# Patient Record
Sex: Male | Born: 1950 | Race: White | Hispanic: No | Marital: Married | Smoking: Former smoker
Health system: Southern US, Community
[De-identification: ages and names within clinical notes are randomized; demographics above are authoritative.]

## PROBLEM LIST (undated history)

## (undated) DIAGNOSIS — I219 Acute myocardial infarction, unspecified: Secondary | ICD-10-CM

## (undated) DIAGNOSIS — E119 Type 2 diabetes mellitus without complications: Secondary | ICD-10-CM

## (undated) DIAGNOSIS — I2510 Atherosclerotic heart disease of native coronary artery without angina pectoris: Secondary | ICD-10-CM

## (undated) DIAGNOSIS — I10 Essential (primary) hypertension: Secondary | ICD-10-CM

## (undated) DIAGNOSIS — E7800 Pure hypercholesterolemia, unspecified: Secondary | ICD-10-CM

## (undated) HISTORY — DX: Pure hypercholesterolemia, unspecified: E78.00

## (undated) HISTORY — PX: GALLBLADDER SURGERY: SHX652

## (undated) HISTORY — DX: Acute myocardial infarction, unspecified: I21.9

## (undated) HISTORY — DX: Essential (primary) hypertension: I10

## (undated) HISTORY — PX: BACK SURGERY: SHX140

## (undated) HISTORY — PX: STENT PLACEMENT VASCULAR (ARMC HX): HXRAD1737

## (undated) HISTORY — DX: Atherosclerotic heart disease of native coronary artery without angina pectoris: I25.10

## (undated) HISTORY — PX: OTHER SURGICAL HISTORY: SHX169

## (undated) HISTORY — DX: Type 2 diabetes mellitus without complications: E11.9

---

## 2014-10-14 DIAGNOSIS — E119 Type 2 diabetes mellitus without complications: Secondary | ICD-10-CM | POA: Insufficient documentation

## 2014-10-14 DIAGNOSIS — E7800 Pure hypercholesterolemia, unspecified: Secondary | ICD-10-CM | POA: Insufficient documentation

## 2014-12-23 ENCOUNTER — Ambulatory Visit (INDEPENDENT_AMBULATORY_CARE_PROVIDER_SITE_OTHER)
Admission: RE | Admit: 2014-12-23 | Discharge: 2014-12-23 | Disposition: A | Discharge status: Home / Self Care | Payer: Medicare Other | Source: Ambulatory Visit | Attending: Internal Medicine | Admitting: Internal Medicine

## 2014-12-23 ENCOUNTER — Encounter: Payer: Self-pay | Admitting: Internal Medicine

## 2014-12-23 ENCOUNTER — Ambulatory Visit (INDEPENDENT_AMBULATORY_CARE_PROVIDER_SITE_OTHER): Payer: Medicare Other | Admitting: Internal Medicine

## 2014-12-23 VITALS — BP 126/70 | HR 80 | Ht 68.0 in | Wt 282.0 lb

## 2014-12-23 DIAGNOSIS — R0600 Dyspnea, unspecified: Secondary | ICD-10-CM | POA: Diagnosis not present

## 2014-12-23 DIAGNOSIS — R0609 Other forms of dyspnea: Secondary | ICD-10-CM

## 2014-12-23 DIAGNOSIS — I10 Essential (primary) hypertension: Secondary | ICD-10-CM | POA: Diagnosis not present

## 2014-12-23 MED ORDER — VALSARTAN 80 MG PO TABS
80.0000 mg | ORAL_TABLET | Freq: Every day | ORAL | Status: DC
Start: 2014-12-23 — End: 2015-05-09

## 2014-12-23 NOTE — Progress Notes (Signed)
Subjective:    Patient ID: Nathan White, male    DOB: [birth year]   MRN: [id number]  HPI  70 yowm  Quit smoking 1995  Due to IHD   at wt 211 with no respiratory problems until May 2015 onset sob/ cp self referred to pulmonary clinic 12/23/2014 p w/u at [facility] neg cards/ pulm ([facility]) per pt.    12/23/2014 1st [facility] office visit/ Wert   Chief Complaint  Patient presents with  . Pulmonary Consult    Self referred. Pt c/o increased SOB with activity, and a warm burning sensation in his chest.  Denies any wheezing, cough, chest congestion/tightness, and sinus drainage.   new onset doe with neg cards w/u and Dr Gerome Apley eval with no dx.  Has chest tightness reproducible with exertion more than 50 ft walking "almost every time, but not always"  - when walks for doctors it never happens.   Smother at hs most nights x 6 months, sleeps in recliner p xanax - no resting symptoms upright   No am cough / lots of acid HB chronically assoc with sensation of constant pnds   No obvious  patterns in day to day or daytime variabilty or assoc  subjective wheeze os. No unusual exp hx or h/o childhood pna/ asthma or knowledge of premature birth.   . Also denies any obvious fluctuation of symptoms with weather or environmental changes or other aggravating or alleviating factors except as outlined above   Current Medications, Allergies, Complete Past Medical History, Past Surgical History, Family History, and Social History were reviewed in [employer] record.          Review of Systems  Constitutional: Negative for fever and unexpected weight change.  HENT: Positive for sneezing. Negative for congestion, dental problem, ear pain, postnasal drip, rhinorrhea, sinus pressure, sore throat and trouble swallowing.   Eyes: Negative for redness and itching.  Respiratory: Positive for shortness of breath. Negative for cough, chest tightness and wheezing.   Cardiovascular: Negative for  palpitations and leg swelling.  Gastrointestinal: Negative for nausea and vomiting.  Genitourinary: Negative for dysuria.  Musculoskeletal: Negative for joint swelling.  Skin: Negative for rash.  Neurological: Negative for headaches.  Hematological: Bruises/bleeds easily.  Psychiatric/Behavioral: Negative for dysphoric mood. The patient is not nervous/anxious.        Objective:   Physical Exam  amb obese wm nad mod voice fatigue   Wt Readings from Last 3 Encounters:  12/23/14 282 lb (127.914 kg)    Vital signs reviewed   HEENT: nl dentition, turbinates, and orophanx. Nl external ear canals without cough reflex   NECK :  without JVD/Nodes/TM/ nl carotid upstrokes bilaterally   LUNGS: no acc muscle use, clear to A and P bilaterally without cough on insp or exp maneuvers   CV:  RRR  no s3 or murmur or increase in P2, no edema   ABD:  soft and nontender with nl excursion in the supine position. No bruits or organomegaly, bowel sounds nl  MS:  warm without deformities, calf tenderness, cyanosis or clubbing  SKIN: warm and dry without lesions    NEURO:  alert, approp, no deficits    CXR PA and Lateral:   12/23/2014 :     I personally reviewed images and agree with radiology impression as follows:    1. No acute findings. 2. Pleural parenchymal scarring at the base of the left hemi thorax          Assessment &  Plan:

## 2014-12-23 NOTE — Patient Instructions (Addendum)
Stop lisinopril and start diovan (valsartan) 80 mg one daily instead - call me if any issues with this substitution  Omeprazole (prilosec) 40 mg Take 30-60 min before first meal of the day and pepcid ac 20 mg at bedtime   GERD (REFLUX)  is an extremely common cause of respiratory symptoms just like yours , many times with no obvious heartburn at all.    It can be treated with medication, but also with lifestyle changes including elevation of the head of your bed (ideally with 6 inch  bed blocks),  Smoking cessation, avoidance of late meals, excessive alcohol, and avoid fatty foods, chocolate, peppermint, colas, red wine, and acidic juices such as orange juice.  NO MINT OR MENTHOL PRODUCTS SO NO COUGH DROPS  USE SUGARLESS CANDY INSTEAD (Jolley ranchers or Stover's or Life Savers) or even ice chips will also do - the key is to swallow to prevent all throat clearing. NO OIL BASED VITAMINS - use powdered substitutes.  Please remember to go to the  x-ray department downstairs for your tests - we will call you with the results when they are available.     Please schedule a follow up office visit in 6 weeks, call sooner if needed - sign for records from Dr Gerome Apley to include pfts-

## 2014-12-24 ENCOUNTER — Encounter: Payer: Self-pay | Admitting: Internal Medicine

## 2014-12-24 DIAGNOSIS — I10 Essential (primary) hypertension: Secondary | ICD-10-CM | POA: Insufficient documentation

## 2014-12-24 NOTE — Assessment & Plan Note (Signed)
Body mass index is 42.89 kg/(m^2).  No results found for: TSH   Contributing to gerd tendency/ doe/reviewed the need and the process to achieve and maintain neg calorie balance > defer f/u primary care including intermittently monitoring thyroid status

## 2014-12-24 NOTE — Assessment & Plan Note (Signed)
ACE inhibitors are problematic in  pts with airway complaints because  even experienced pulmonologists can't always distinguish ace effects from copd/asthma/pnds/ allergies etc.  By themselves they don't actually cause a problem, much like oxygen can't by itself start a fire, but they certainly serve as a powerful catalyst or enhancer for any "fire"  or inflammatory process in the upper airway, be it caused by an ET  tube or more commonly reflux (especially in the obese or pts with known GERD or who are on biphoshonates) or URI's, due to interference with bradykinin clearance.  The effects of acei on bradykinin levels occurs in 100% of pt's on acei (unless they surreptitiously stop the med!) but the classic cough is only reported in 5%.  This leaves 95% of pts on acei's  with a variety of syndromes including no identifiable symptom in most  vs non-specific symptoms that wax and wane depending on what other insult is occuring at the level of the upper airway, esp gerd which is likely in this case.  Only way to know is trial off acei > on avaprol 80 mg daily x 6 weeks then regroup

## 2014-12-24 NOTE — Assessment & Plan Note (Signed)
Symptoms are markedly disproportionate to objective findings and not clear this is a lung problem but pt does appear to have difficult airway management issues. DDX of  difficult airways management all start with A and  include Adherence, Ace Inhibitors, Acid Reflux, Active Sinus Disease, Alpha 1 Antitripsin deficiency, Anxiety masquerading as Airways dz,  ABPA,  allergy(esp in young), Aspiration (esp in elderly), Adverse effects of meds,  Active smokers, A bunch of PE's (a small clot burden can't cause this syndrome unless there is already severe underlying pulm or vascular dz with poor reserve) plus two Bs  = Bronchiectasis and Beta blocker use..and one C= CHF   ACEi at the top of the usual list of suspects > needs trial off as first step  ? Acid (or non-acid) GERD > always difficult to exclude as up to 75% of pts in some series report no assoc GI/ Heartburn symptoms> rec max (24h)  acid suppression and diet restrictions/ reviewed and instructions given in writing.   ? Anxiety > dx of exclusion  ? Allergy/asthma/ copd > very strongly doubt, need Dr Florentina Addison pfts to complete the w/u   Total time = 24m review case with pt/ discussion/ counseling/ giving and going over instructions (see avs)    Each maintenance medication was reviewed in detail including most importantly the difference between maintenance and as needed and under what circumstances the prns are to be used.  Please see instructions for details which were reviewed in writing and the patient given a copy.

## 2014-12-24 NOTE — Progress Notes (Signed)
Quick Note:  Spoke with pt and notified of results per Dr. Wert. Pt verbalized understanding and denied any questions.  ______ 

## 2015-02-03 ENCOUNTER — Encounter: Payer: Self-pay | Admitting: Internal Medicine

## 2015-02-03 ENCOUNTER — Ambulatory Visit (INDEPENDENT_AMBULATORY_CARE_PROVIDER_SITE_OTHER): Payer: Medicare Other | Admitting: Internal Medicine

## 2015-02-03 VITALS — BP 122/72 | HR 85 | Ht 68.0 in | Wt 286.0 lb

## 2015-02-03 DIAGNOSIS — R0600 Dyspnea, unspecified: Secondary | ICD-10-CM

## 2015-02-03 DIAGNOSIS — I10 Essential (primary) hypertension: Secondary | ICD-10-CM | POA: Diagnosis not present

## 2015-02-03 NOTE — Progress Notes (Signed)
Subjective:    Patient ID: Nathan White, male    DOB: 03/08/1951   MRN: [id number]    Brief patient profile:  [age] yowm  Quit smoking 1995  Due to IHD   at wt 89 with no respiratory problems until May 2015 onset sob/ cp self [facility] pulmonary clinic [facility] pt > all symptoms resolved off ACEi since initial pulmonary eval.     History of Present Illness  12/23/2014 1st [facility] office visit/ Nathan White   Chief Complaint  Patient presents with  . Pulmonary Consult    Self referred. Pt c/o increased SOB with activity, and a warm burning sensation in his chest.  Denies any wheezing, cough, chest congestion/tightness, and sinus drainage.   new onset doe with neg cards w/u and Dr Nathan White eval with no dx.  Has chest tightness reproducible with exertion more than 50 ft walking "almost every time, but not always"  - when walks for doctors it never happens.  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 Follow-up    Pt states that he breathing is doing well. Pt denies cough/wheeze/SB/tightness.   Not limited by breathing from desired activities  Though not very active  No obvious day to day or daytime variability or assoc chronic cough or cp or chest tightness, subjective wheeze or overt sinus or hb symptoms. No unusual exp hx or h/o childhood pna/ asthma or knowledge of premature birth.  Sleeping ok without nocturnal  or early am exacerbation  of respiratory  c/o's or  need for noct saba. Also denies any obvious fluctuation of symptoms with weather or environmental changes or other aggravating or alleviating factors except as outlined above   Current Medications, Allergies, Complete Past Medical History, Past Surgical History, Family History, and Social History were reviewed in [facility] electronic medical record.  ROS  The following are not active complaints unless bolded sore throat, dysphagia, dental problems, itching, sneezing,  nasal congestion or excess/ purulent secretions, ear ache,   fever, chills, sweats, unintended wt loss, classically pleuritic or exertional cp, hemoptysis,  orthopnea pnd or leg swelling, presyncope, palpitations, abdominal pain, anorexia, nausea, vomiting, diarrhea  or change in bowel or bladder habits, change in stools or urine, dysuria,hematuria,  rash, arthralgias, visual complaints, headache, numbness, weakness or ataxia or problems with walking or coordination,  change in mood/affect or memory.         Objective:   Physical Exam  amb obese wm nad min hoarseness now     Wt Readings from Last 3 Encounters:  02/03/15 286 lb (129.729 kg)  12/23/14 282 lb (127.914 kg)    Vital signs reviewed     HEENT: nl dentition, turbinates, and orophanx. Nl external ear canals without cough reflex   NECK :  without JVD/Nodes/TM/ nl carotid upstrokes bilaterally   LUNGS: no acc muscle use, clear to A and P  bilaterally without cough on insp or exp maneuvers   CV:  RRR  no s3 or murmur or increase in P2, no edema   ABD:  Massively soft and nontender with nl excursion in the supine position. No bruits or organomegaly, bowel sounds nl  MS:  warm without deformities, calf tenderness, cyanosis or clubbing  SKIN: warm and dry without lesions    NEURO:  alert, approp, no deficits    CXR PA and Lateral:   12/23/2014 :     I personally reviewed images and agree with radiology impression as follows:   1. No acute findings. 2.  Pleural parenchymal scarring at the base of the left hemi thorax       Assessment & Plan:   Outpatient Encounter Prescriptions as of 02/03/2015  Medication Sig  . ALPRAZolam (XANAX) 1 MG tablet Take 1 mg by mouth at bedtime as needed for anxiety.  . clopidogrel (PLAVIX) 75 MG tablet Take 75 mg by mouth daily.  . furosemide (LASIX) 40 MG tablet Take 40 mg by mouth daily.  Marland Kitchen glipiZIDE (GLUCOTROL) 10 MG tablet Take 10 mg by mouth 2 (two) times daily before a meal.  . hydrochlorothiazide (HYDRODIURIL) 25 MG tablet Take 25 mg by mouth every morning.  Marland Kitchen levothyroxine (SYNTHROID, LEVOTHROID) 75 MCG tablet Take 1 tablet by mouth daily.  . metFORMIN (GLUCOPHAGE) 500 MG tablet Take 500 mg by mouth 2 (two) times daily with a meal.  . metoprolol succinate (TOPROL-XL) 50 MG 24 hr tablet Take 1/2 tablet by mouth following a meal.  . nitroGLYCERIN (NITROSTAT) 0.4 MG SL tablet Place 0.4 mg under the tongue daily as needed.  Marland Kitchen omeprazole (PRILOSEC) 20 MG capsule Take 1 capsule by mouth daily.  . pravastatin (PRAVACHOL) 80 MG tablet Take 80 mg by mouth daily.  . valsartan (DIOVAN) 80 MG tablet Take 1 tablet (80 mg total) by mouth daily.  . [DISCONTINUED] lisinopril (PRINIVIL,ZESTRIL) 5 MG tablet Take 1 tablet by mouth daily.  . [DISCONTINUED] levothyroxine (SYNTHROID, LEVOTHROID) 100 MCG tablet Take 100 mcg by mouth daily before breakfast.   No facility-administered encounter medications on file as of 02/03/2015.

## 2015-02-03 NOTE — Assessment & Plan Note (Signed)
Trial off acei due to unexplained sob 12/23/2014 > resolved  In the best review of chronic cough to date ( NEJM 2016 375 [id number]) ,  ACEi are now felt to cause cough in up to  20% of pts which is a 4 fold increase from previous reports and does not include the variety of non-specific complaints we see in pulmonary clinic in pts on ACEi but previously attributed to copd/asthma to include PNDS, throat and chest congestion, "bronchitis", unexplained dyspnea and noct "strangling" sensations as well as atypical /refractory GERD symptoms like atypical dysphagia.   The only way to prove this is not an "ACEi Case" is a trial off ACEi x a minimum of 6 weeks> clearly much better off acei and bp ok on arb so no change rx needed

## 2015-02-03 NOTE — Assessment & Plan Note (Signed)
Body mass index is 43.5 trending up  No results found for: TSH   Contributing to gerd tendency/ doe/reviewed the need and the process to achieve and maintain neg calorie balance > defer f/u primary care including intermittently monitoring thyroid status

## 2015-02-03 NOTE — Patient Instructions (Signed)
Please schedule a follow up office visit in 6 weeks, call sooner if needed for pfts and bring inhalers with you  If breathing or cough worse in meantime >>> Try prilosec otc 20mg   Take 30-60 min before first meal of the day and Pepcid ac (famotidine) 20 mg one @  bedtime until  Return to office

## 2015-02-03 NOTE — Assessment & Plan Note (Signed)
-   spirometry 05/10/14  FEV1  (1.42) 44% ratio 84  - trial off acei 12/23/2014 > marked improvement    He is clearly better off acei but still has some hoarseness/ throat clearing likley related to gerd  rec  Stay off acei/ low threshold to add back gerd rx/ work on wt and return for fresh pfts and any inhalers he still has at home  I had an extended discussion with the patient reviewing all relevant studies completed to date and  lasting 15 to 20 minutes of a 25 minute visit    Each maintenance medication was reviewed in detail including most importantly the difference between maintenance and prns and under what circumstances the prns are to be triggered using an action plan format that is not reflected in the computer generated alphabetically organized AVS.    Please see instructions for details which were reviewed in writing and the patient given a copy highlighting the part that I personally wrote and discussed at today's ov.

## 2015-03-21 ENCOUNTER — Encounter: Payer: Self-pay | Admitting: Internal Medicine

## 2015-03-21 ENCOUNTER — Ambulatory Visit (INDEPENDENT_AMBULATORY_CARE_PROVIDER_SITE_OTHER): Payer: Medicare Other | Admitting: Internal Medicine

## 2015-03-21 VITALS — BP 112/62 | HR 72 | Ht 68.0 in | Wt 283.0 lb

## 2015-03-21 DIAGNOSIS — R918 Other nonspecific abnormal finding of lung field: Secondary | ICD-10-CM | POA: Diagnosis not present

## 2015-03-21 DIAGNOSIS — R079 Chest pain, unspecified: Secondary | ICD-10-CM | POA: Insufficient documentation

## 2015-03-21 DIAGNOSIS — I10 Essential (primary) hypertension: Secondary | ICD-10-CM

## 2015-03-21 DIAGNOSIS — R0600 Dyspnea, unspecified: Secondary | ICD-10-CM | POA: Diagnosis not present

## 2015-03-21 LAB — PULMONARY FUNCTION TEST
DL/VA % pred: 120 %
DL/VA: 5.49 ml/min/mmHg/L
DLCO unc % pred: 69 %
DLCO unc: 21.64 ml/min/mmHg
FEF 25-75 Post: 3.22 L/sec
FEF 25-75 Pre: 2.29 L/sec
FEF2575-%CHANGE-POST: 40 %
FEF2575-%PRED-PRE: 88 %
FEF2575-%Pred-Post: 124 %
FEV1-%CHANGE-POST: 10 %
FEV1-%Pred-Post: 63 %
FEV1-%Pred-Pre: 57 %
FEV1-POST: 2.07 L
FEV1-Pre: 1.88 L
FEV1FVC-%CHANGE-POST: 2 %
FEV1FVC-%Pred-Pre: 113 %
FEV6-%Change-Post: 7 %
FEV6-%PRED-PRE: 53 %
FEV6-%Pred-Post: 57 %
FEV6-PRE: 2.23 L
FEV6-Post: 2.4 L
FEV6FVC-%PRED-PRE: 105 %
FEV6FVC-%Pred-Post: 105 %
FVC-%CHANGE-POST: 7 %
FVC-%PRED-PRE: 50 %
FVC-%Pred-Post: 54 %
FVC-POST: 2.4 L
FVC-PRE: 2.24 L
POST FEV1/FVC RATIO: 86 %
POST FEV6/FVC RATIO: 100 %
PRE FEV6/FVC RATIO: 100 %
Pre FEV1/FVC ratio: 84 %
RV % PRED: 74 %
RV: 1.73 L
TLC % pred: 64 %
TLC: 4.4 L

## 2015-03-21 NOTE — Progress Notes (Addendum)
Subjective:    Patient ID: Nathan White, male    DOB: 02/27/1951   MRN: [id number]    Brief patient profile:  [age] yowm  Quit smoking 1995  Due to IHD   at wt 89 with no respiratory problems until May 2015 onset sob/ cp self referred to pulmonary clinic [facility] per pt > all symptoms improved  off ACEi since initial pulmonary eval.   History of Present Illness  12/23/2014 1st [facility] office visit/ Terrie Haring   Chief Complaint  Patient presents with  . Pulmonary Consult    Self referred. Pt c/o increased SOB with activity, and a warm burning sensation in his chest.  Denies any wheezing, cough, chest congestion/tightness, and sinus drainage.   new onset doe with neg cards w/u and Dr Gerome [facility] eval with no dx.  Has chest tightness reproducible with exertion more than 50 ft walking "almost every time, but not always"  - when walks for doctors it never happens.  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 Follow-up    Pt states that he breathing is doing well. Pt denies cough/wheeze/SB/tightness.   Not limited by breathing from desired activities  Though not very active rec Please schedule a follow up office visit in 6 weeks, call sooner if needed for pfts and bring inhalers with you If breathing or cough worse in meantime >>> Try prilosec otc 20mg   Take 30-60 min before first meal of the day and Pepcid ac (famotidine) 20 mg one @  bedtime until  Return to office     03/21/2015  f/u ov/Laveta Gilkey re: on ppi ac and h2 hs / did not bring  inhalers as requested "they don't work anywayEngineering geologist"  Chief Complaint  Patient presents with  . Follow-up    Pt c/o cough with some yellow mucus, SOB and wheeze. Pt also states that he cannot sleep laying flat, he feels like he can't breathe. He has been sleeping with his bed raised and extra pillows. Pt denies any current LE edema.       If takes xanax can sleep fine x 8 h lying flat s smothering but does wake up sometimes with ha/ feels tired    Walks 200 ft slow pace sob  every single time flat grade  No obvious day to day or daytime variability or assoc cp or chest tightness, subjective wheeze or overt sinus or hb symptoms. No unusual exp hx or h/o childhood pna/ asthma or knowledge of premature birth.  Sleeping ok without nocturnal  or early am exacerbation  of respiratory  c/o's or need for noct saba. Also denies any obvious fluctuation of symptoms with weather or environmental changes or other aggravating or alleviating factors except as outlined above   Current Medications, Allergies, Complete Past Medical History, Past Surgical History, Family History, and Social History were reviewed in [facility] electronic medical record.  ROS  The following are not active complaints unless bolded sore throat, dysphagia, dental problems, itching, sneezing,  nasal congestion or excess/ purulent secretions, ear ache,   fever, chills, sweats, unintended wt loss, classically pleuritic or exertional cp, hemoptysis,  orthopnea pnd or leg swelling, presyncope, palpitations, abdominal pain, anorexia, nausea, vomiting, diarrhea  or change in bowel or bladder habits, change in stools or urine, dysuria,hematuria,  rash, arthralgias, visual complaints, headache, numbness, weakness or ataxia or problems with walking or coordination,  change in mood/affect or memory.                  Objective:  Physical Exam   amb obese wm nad       03/21/2015      283    02/03/15 286 lb (129.729 kg)  12/23/14 282 lb (127.914 kg)    Vital signs reviewed     HEENT: nl  turbinates, and orophanx. Nl external ear canals without cough reflex-  Upper and lower dentures    NECK :  without JVD/Nodes/TM/ nl carotid upstrokes bilaterally   LUNGS: no acc muscle use, clear to A and P bilaterally without cough on insp or exp maneuvers   CV:  RRR  no s3 or murmur or increase in P2, no edema   ABD:  Massively soft and nontender with nl excursion in the supine position. No bruits or organomegaly, bowel sounds nl  MS:  warm without deformities, calf tenderness, cyanosis or clubbing  SKIN: warm and dry without lesions    NEURO:  alert, approp, no deficits    CXR PA and Lateral:   12/23/2014 :     I personally reviewed images and agree with radiology impression as follows:   1. No acute findings. 2. Pleural parenchymal scarring at the base of the left hemi thorax   Labs ordered 03/21/2015 but not done:  Cbc, tsh, bnp, bmet     Assessment & Plan:

## 2015-03-21 NOTE — Patient Instructions (Addendum)
Weight control is simply a matter of calorie balance which needs to be tilted in your favor by eating less and exercising more.  To get the most out of exercise, you need to be continuously aware that you are short of breath, but never out of breath, for 30 minutes daily. As you improve, it will actually be easier for you to do the same amount of exercise  in  30 minutes so always push to the level where you are short of breath.  If this does not result in gradual weight reduction then I strongly recommend you see a nutritionist with a food diary x 2 weeks so that we can work out a negative calorie balance which is universally effective in steady weight loss programs.  Think of your calorie balance like you do your bank account where in this case you want the balance to go down so you must take in less calories than you burn up.  It's just that simple:  Hard to do, but easy to understand.  Good luck!     Please remember to go to the lab department downstairs for your tests - we will call you with the results when they are available. We will need to get the report on the HP CT and call you with recommendations for follow up after it is reviewed here  Late add take whole lopressor = 50 mg daily and a half a diovan and we will send you to our cardilogists for your ongoing exertional chest pain and we will request records in meantime Late add: did not go to lab, f/u CT chest due also per fleisher guidelines no contrast dx mpns

## 2015-03-21 NOTE — Progress Notes (Signed)
PFT done today. 

## 2015-03-22 ENCOUNTER — Encounter: Payer: Self-pay | Admitting: Internal Medicine

## 2015-03-22 DIAGNOSIS — R918 Other nonspecific abnormal finding of lung field: Secondary | ICD-10-CM | POA: Insufficient documentation

## 2015-03-22 NOTE — Assessment & Plan Note (Signed)
Complicated by HBP/ Hyperlipidemia/ DM and ERV 30% by pfts 03/21/2015   Body mass index is 43.04  No change   No results found for: TSH   Contributing to gerd tendency/ doe/reviewed the need and the process to achieve and maintain neg calorie balance > defer f/u primary care including intermittently monitoring thyroid status

## 2015-03-22 NOTE — Assessment & Plan Note (Signed)
Trial off acei due to unexplained sob x 50 ft 12/23/2014 > resolved 02/03/2015   As he appeared to have transient chest discomfort at end of exercise today I recommended he double the metoprolol dose to a total of 50 mg per day but reduce the Diovan to one half tablet a day or 40 mg per pending cardiology reevaluation

## 2015-03-22 NOTE — Assessment & Plan Note (Addendum)
-   spirometry 05/10/14  FEV1  (1.42) 44% ratio 84  - Cards eval in [facility] 11/14/14 [facility] (care everywhere) > rec lexiscan but not done  - trial off acei 12/23/2014 > marked improvement   - 03/21/2015  Walked RA x 3 laps @ 185 ft each stopped due to End of study, nl pace, no sob or desat  But mild chest burning that resolved w/in a few min at rest  - PFT's  03/21/2015  FEV1 2.07 (63 % ) ratio 86  p 10 % improvement from saba with DLCO  69 % corrects to 120 % for alv volume and ERV 30%     His overall fxn and pfts much better off acei with a restrictive pattern now that his explained by his body habitus and does not require pulmonary meds at this point but needs to work on wt loss   I had an extended summary final discussion with the patient reviewing all relevant studies completed to date and  lasting 25 minutes of a 40  minute visit    Each maintenance medication was reviewed in detail including most importantly the difference between maintenance and prns and under what circumstances the prns are to be triggered using an action plan format that is not reflected in the computer generated alphabetically organized AVS.    Please see instructions for details which were reviewed in writing and the patient given a copy highlighting the part that I personally wrote and discussed at today's ov.

## 2015-03-22 NOTE — Assessment & Plan Note (Addendum)
CT results reviewed with pt >>> Too small for PET or bx, not suspicious enough for excisional bx > really only option for now is follow the Fleischner society guidelines as rec by radiology. = repeat non contrast CT due now   Note concern re asbestos exposure, will review with pt p repeat ct

## 2015-03-22 NOTE — Assessment & Plan Note (Signed)
-   Cards eval in [facility] 11/14/14 [facility] (care everywhere) > rec lexiscan but not done  Referred to Cardiology 03/21/2015  And rec double dose of metaprol to 50 mg daily in meantime/ reduce the diovan to one half daily as bp on low side

## 2015-03-25 ENCOUNTER — Telehealth: Payer: Self-pay | Admitting: *Deleted

## 2015-03-25 DIAGNOSIS — R918 Other nonspecific abnormal finding of lung field: Secondary | ICD-10-CM

## 2015-03-25 NOTE — Telephone Encounter (Signed)
LMTCB

## 2015-03-25 NOTE — Telephone Encounter (Signed)
-----   Message from Nyoka CowdenMichael B Wert, MD sent at 03/22/2015  8:37 AM EST ----- did not go to lab, f/u CT chest due also per fleisher guidelines no contrast dx mpns

## 2015-03-26 NOTE — Telephone Encounter (Signed)
Called and spoke with patient's wife Boneta LucksJenny. Informed her that patient needed labs and a CT done. I explained to her that he could come to the lab at any time between 8:30-5:30 for labs and [facility]. She voiced understanding and had no further questions. CT order placed. Nothing further needed.

## 2015-03-26 NOTE — Telephone Encounter (Signed)
Patient returned call. Please call his wife Colin Bentonjenny Mahaney at [phone] with information

## 2015-04-03 ENCOUNTER — Ambulatory Visit (INDEPENDENT_AMBULATORY_CARE_PROVIDER_SITE_OTHER)
Admission: RE | Admit: 2015-04-03 | Discharge: 2015-04-03 | Disposition: A | Discharge status: Home / Self Care | Payer: Medicare Other | Source: Ambulatory Visit | Attending: Internal Medicine | Admitting: Internal Medicine

## 2015-04-03 DIAGNOSIS — R918 Other nonspecific abnormal finding of lung field: Secondary | ICD-10-CM

## 2015-05-09 ENCOUNTER — Encounter: Payer: Self-pay | Admitting: Cardiology

## 2015-05-09 ENCOUNTER — Encounter: Payer: Self-pay | Admitting: *Deleted

## 2015-05-09 ENCOUNTER — Ambulatory Visit (INDEPENDENT_AMBULATORY_CARE_PROVIDER_SITE_OTHER): Payer: Medicare Other | Admitting: Cardiology

## 2015-05-09 VITALS — BP 126/84 | HR 74 | Ht 68.0 in | Wt 279.0 lb

## 2015-05-09 DIAGNOSIS — Z01810 Encounter for preprocedural cardiovascular examination: Secondary | ICD-10-CM

## 2015-05-09 DIAGNOSIS — I208 Other forms of angina pectoris: Secondary | ICD-10-CM | POA: Diagnosis not present

## 2015-05-09 DIAGNOSIS — E785 Hyperlipidemia, unspecified: Secondary | ICD-10-CM | POA: Insufficient documentation

## 2015-05-09 DIAGNOSIS — I25810 Atherosclerosis of coronary artery bypass graft(s) without angina pectoris: Secondary | ICD-10-CM | POA: Insufficient documentation

## 2015-05-09 DIAGNOSIS — I25708 Atherosclerosis of coronary artery bypass graft(s), unspecified, with other forms of angina pectoris: Secondary | ICD-10-CM

## 2015-05-09 LAB — CBC
HCT: 47.2 % (ref 39.0–52.0)
Hemoglobin: 16.1 g/dL (ref 13.0–17.0)
MCH: 30.4 pg (ref 26.0–34.0)
MCHC: 34.1 g/dL (ref 30.0–36.0)
MCV: 89.2 fL (ref 78.0–100.0)
MPV: 11.2 fL (ref 8.6–12.4)
PLATELETS: 206 10*3/uL (ref 150–400)
RBC: 5.29 MIL/uL (ref 4.22–5.81)
RDW: 13.6 % (ref 11.5–15.5)
WBC: 10.8 10*3/uL — AB (ref 4.0–10.5)

## 2015-05-09 LAB — PROTIME-INR
INR: 0.95 (ref <1.50)
PROTHROMBIN TIME: 12.8 s (ref 11.6–15.2)

## 2015-05-09 LAB — BASIC METABOLIC PANEL
BUN: 17 mg/dL (ref 7–25)
CALCIUM: 9.6 mg/dL (ref 8.6–10.3)
CO2: 29 mmol/L (ref 20–31)
CREATININE: 1.06 mg/dL (ref 0.70–1.25)
Chloride: 96 mmol/L — ABNORMAL LOW (ref 98–110)
Glucose, Bld: 214 mg/dL — ABNORMAL HIGH (ref 65–99)
Potassium: 3.7 mmol/L (ref 3.5–5.3)
SODIUM: 135 mmol/L (ref 135–146)

## 2015-05-09 MED ORDER — ISOSORBIDE MONONITRATE ER 30 MG PO TB24: 30.0000 mg | ORAL_TABLET | Freq: Every day | ORAL | Status: DC

## 2015-05-09 NOTE — Patient Instructions (Signed)
Medication Instructions:  Please start Isosorbide 30 mg a day. Continue all other medications as listed.  Labwork: Please have blood work today (CBC, BMP and PT)  Testing/Procedures: Your physician has requested that you have a cardiac catheterization. Cardiac catheterization is used to diagnose and/or treat various heart conditions. Doctors may recommend this procedure for a number of different reasons. The most common reason is to evaluate chest pain. Chest pain can be a symptom of coronary artery disease (CAD), and cardiac catheterization can show whether plaque is narrowing or blocking your heart's arteries. This procedure is also used to evaluate the valves, as well as measure the blood flow and oxygen levels in different parts of your heart. For further information please visit [email or web address] Please follow instruction sheet, as given.  Follow-Up: Follow up in 1 month with Dr Anne Fu.  Thank you for choosing [facility]!!

## 2015-05-09 NOTE — Progress Notes (Signed)
Cardiology Office Note    Date:  05/09/2015   ID:  Nathan White, DOB 1950-07-19, MRN [id number]  PCP:  Quitman Livings, MD  Cardiologist:   Donato Schultz, MD     History of Present Illness:  Nathan White is a 65 y.o. male  Here for the evaluation of chest pain at the request of Dr. Sherene Sires. Previously had been referred to the pulmonary clinic in September 2016 because of shortness of breath/chest pain. Has a warm burning sensation in his chest, no wheezing, no cough. Per Dr. Thurston Hole note had had a negative cardiac workup. He has chest tightness that was reproducible with exertion more than 50 feet of walking almost every time but not always. A few times he has had a smother like sensation over the past couple months and sleeps in a recliner after taking  Xanax.  If lifts something burning. Had CAD, stents in [place]. Had CABG. In 8 at age 11 had MI. 12 years later had CABG. Dr. Arvilla Market. Left  Radial harvested.  In Jan 2016 - 2 blockages, one was opened and one could not be opened.   Grandson died from drunk driver. On motorcycle.    Dr. Richardine Service.     Past Medical History  Diagnosis Date  . High blood pressure   . Heart attack (HCC)   . Diabetes (HCC)   . High cholesterol     Past Surgical History  Procedure Laterality Date  . Gallbladder surgery    . Back surgery    . Cardiac bypass    . Stent placement vascular ([facility] hx)      Outpatient Prescriptions Prior to Visit  Medication Sig Dispense Refill  . ALPRAZolam (XANAX) 1 MG tablet Take 1 mg by mouth at bedtime as needed for anxiety.    . clopidogrel (PLAVIX) 75 MG tablet Take 75 mg by mouth daily.    . furosemide (LASIX) 40 MG tablet Take 80 mg by mouth daily.     Marland Kitchen glipiZIDE (GLUCOTROL) 10 MG tablet Take 10 mg by mouth daily before breakfast.     . hydrochlorothiazide (HYDRODIURIL) 25 MG tablet Take 25 mg by mouth every morning.    Marland Kitchen levothyroxine (SYNTHROID, LEVOTHROID) 100 MCG tablet Take 1 tablet by mouth  daily.    . metFORMIN (GLUCOPHAGE) 500 MG tablet Take 500 mg by mouth 2 (two) times daily with a meal.    . nitroGLYCERIN (NITROSTAT) 0.4 MG SL tablet Place 0.4 mg under the tongue daily as needed.    Marland Kitchen omeprazole (PRILOSEC) 20 MG capsule Take 1 capsule by mouth daily.    . pravastatin (PRAVACHOL) 80 MG tablet Take 80 mg by mouth daily.    . metoprolol succinate (TOPROL-XL) 50 MG 24 hr tablet Take 1/2 tablet by mouth following a meal.    . valsartan (DIOVAN) 80 MG tablet Take 1 tablet (80 mg total) by mouth daily. 30 tablet 11   No facility-administered medications prior to visit.     Allergies:   Review of patient's allergies indicates no known allergies.   Social History   Social History  . Marital Status: Married    Spouse Name: N/A  . Number of Children: N/A  . Years of Education: N/A   Occupational History  . retired    Social History Main Topics  . Smoking status: Former Smoker    Quit date: 07/31/1993  . Smokeless tobacco: None  . Alcohol Use: No  . Drug Use: None  . Sexual  Activity: Not Asked   Other Topics Concern  . None   Social History Narrative     Family History:  The patient's family history includes Cancer in his brother; Heart attack in his brother, brother, and mother.   ROS:   Please see the history of present illness.    ROS  Leg swelling, shortness of breath, shortness of breath with activity, depression, anxiety, wheezing, fatigue All other systems reviewed and are negative.   PHYSICAL EXAM:   VS:  BP 126/84 mmHg  Pulse 74  Ht 5\' 8"  (1.727 m)  Wt 279 lb (126.554 kg)  BMI 42.43 kg/m2   GEN: Well nourished, well developed, in no acute distress HEENT: normal Neck: no JVD, carotid bruits, or masses Cardiac: RRR; no murmurs, rubs, or gallops,no edema  Respiratory:  clear to auscultation bilaterally, normal work of breathing GI: soft, nontender, nondistended, + BS , obese MS: no deformity or atrophy Skin: warm and dry, no rash Neuro:  Alert  and Oriented x 3, Strength and sensation are intact Psych: euthymic mood, full affect  Wt Readings from Last 3 Encounters:  05/09/15 279 lb (126.554 kg)  03/21/15 283 lb (128.368 kg)  02/03/15 286 lb (129.729 kg)      Studies/Labs Reviewed:   EKG:  EKG is ordered today.  The ekg ordered today demonstrates  05/09/15 shows normal sinus rhythm, 75 with nonspecific ST-T wave changes, small Q waves in the inferior leads noted.  Recent Labs: No results found for requested labs within last 365 days.   Lipid Panel No results found for: CHOL, TRIG, HDL, CHOLHDL, VLDL, LDLCALC, LDLDIRECT  Additional studies/ records that were reviewed today include:  - spirometry 05/10/14 FEV1 (1.42) 44% ratio 84  - Cards eval in [facility] 11/14/14 [facility] (care everywhere) > rec lexiscan but not done  - trial off acei 12/23/2014 > marked improvement  - 03/21/2015 Walked RA x 3 laps @ 185 ft each stopped due to End of study, nl pace, no sob or desat But mild chest burning that resolved w/in a few min at rest  - PFT's 03/21/2015 FEV1 2.07 (63 % ) ratio 86 p 10 % improvement from saba with DLCO 69 % corrects to 120 % for alv volume and ERV 30%      ASSESSMENT:    1. Angina decubitus (HCC)   2. 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 Morbid obesity due to excess calories (HCC)   4. Hyperlipidemia   5. Pre-operative cardiovascular examination      PLAN:  In order of problems listed above:  1. Exertional angina-classic exertional anginal symptoms. We will start isosorbide 30 mg once a day. Continue with metoprolol. Last cardiac catheterization was in January 2016 at [place] by Dr. Garner Nash. He states that he went into the right radial however he does have prior bypass surgery from Dr. Arvilla Market in 200 2 and his left radial artery was harvested for the bypass. It is likely that he has a LIMA bypass graft. I explained the technical aspects of not  having the left radial approach option. May have to go femoral. We're obtaining records from the last heart catheterization. I have look through tear everywhere but I do not see the results of the cardiac catheterization. He is currently on Plavix. He states that in January, one stent was placed however another area could not be opened. He does  remember having relief after getting this stent  placed. Risks and benefits discussed ,, heart attack, bleeding. 2.  COPD-per Dr. Sherene Sires. ACE inhibitor was stopped, valsartan was started. 3.  Morbid obesity-continue to encourage weight loss. 4.  Hyperlipidemia-currently on pravastatin    Medication Adjustments/Labs and Tests Ordered: Current medicines are reviewed at length with the patient today.  Concerns regarding medicines are outlined above.  Medication changes, Labs and Tests ordered today are listed in the Patient Instructions below. Patient Instructions  Medication Instructions:  Please start Isosorbide 30 mg a day. Continue all other medications as listed.  Labwork: Please have blood work today (CBC, BMP and PT)  Testing/Procedures: Your physician has requested that you have a cardiac catheterization. Cardiac catheterization is used to diagnose and/or treat various heart conditions. Doctors may recommend this procedure for a number of different reasons. The most common reason is to evaluate chest pain. Chest pain can be a symptom of coronary artery disease (CAD), and cardiac catheterization can show whether plaque is narrowing or blocking your heart's arteries. This procedure is also used to evaluate the valves, as well as measure the blood flow and oxygen levels in different parts of your heart. For further information please visit [email or web address] Please follow instruction sheet, as given.  Follow-Up: Follow up in 1 month with Dr Anne Fu.  Thank you for choosing [facility]!!           Signed, Donato Schultz, MD    05/09/2015 9:32 AM    [facility] [address] [place], [place], [place]  [address] Phone: [phone]; Fax: [phone]

## 2015-05-09 NOTE — Addendum Note (Signed)
Addended by: Donato Schultz C on: 05/09/2015 05:30 PM   Modules accepted: Orders

## 2015-05-12 ENCOUNTER — Encounter (HOSPITAL_COMMUNITY)
Admission: RE | Disposition: A | Discharge status: Home / Self Care | Payer: Self-pay | Source: Ambulatory Visit | Attending: Cardiovascular Disease

## 2015-05-12 ENCOUNTER — Ambulatory Visit (HOSPITAL_COMMUNITY)
Admission: RE | Admit: 2015-05-12 | Discharge: 2015-05-12 | Disposition: A | Discharge status: Home / Self Care | Payer: Medicare Other | Source: Ambulatory Visit | Attending: Cardiovascular Disease | Admitting: Cardiovascular Disease

## 2015-05-12 ENCOUNTER — Telehealth: Payer: Self-pay | Admitting: Cardiology

## 2015-05-12 DIAGNOSIS — E7800 Pure hypercholesterolemia, unspecified: Secondary | ICD-10-CM | POA: Insufficient documentation

## 2015-05-12 DIAGNOSIS — Z87891 Personal history of nicotine dependence: Secondary | ICD-10-CM | POA: Diagnosis not present

## 2015-05-12 DIAGNOSIS — E119 Type 2 diabetes mellitus without complications: Secondary | ICD-10-CM | POA: Insufficient documentation

## 2015-05-12 DIAGNOSIS — Z8249 Family history of ischemic heart disease and other diseases of the circulatory system: Secondary | ICD-10-CM | POA: Diagnosis not present

## 2015-05-12 DIAGNOSIS — I2582 Chronic total occlusion of coronary artery: Secondary | ICD-10-CM | POA: Insufficient documentation

## 2015-05-12 DIAGNOSIS — Z7902 Long term (current) use of antithrombotics/antiplatelets: Secondary | ICD-10-CM | POA: Diagnosis not present

## 2015-05-12 DIAGNOSIS — Z6841 Body Mass Index (BMI) 40.0 and over, adult: Secondary | ICD-10-CM | POA: Insufficient documentation

## 2015-05-12 DIAGNOSIS — Z7984 Long term (current) use of oral hypoglycemic drugs: Secondary | ICD-10-CM | POA: Insufficient documentation

## 2015-05-12 DIAGNOSIS — I25710 Atherosclerosis of autologous vein coronary artery bypass graft(s) with unstable angina pectoris: Secondary | ICD-10-CM | POA: Insufficient documentation

## 2015-05-12 DIAGNOSIS — I252 Old myocardial infarction: Secondary | ICD-10-CM | POA: Diagnosis not present

## 2015-05-12 DIAGNOSIS — I2510 Atherosclerotic heart disease of native coronary artery without angina pectoris: Secondary | ICD-10-CM | POA: Diagnosis not present

## 2015-05-12 DIAGNOSIS — Z955 Presence of coronary angioplasty implant and graft: Secondary | ICD-10-CM | POA: Insufficient documentation

## 2015-05-12 DIAGNOSIS — I25810 Atherosclerosis of coronary artery bypass graft(s) without angina pectoris: Secondary | ICD-10-CM | POA: Insufficient documentation

## 2015-05-12 DIAGNOSIS — I208 Other forms of angina pectoris: Secondary | ICD-10-CM | POA: Insufficient documentation

## 2015-05-12 HISTORY — PX: CARDIAC CATHETERIZATION: SHX172

## 2015-05-12 LAB — GLUCOSE, CAPILLARY
GLUCOSE-CAPILLARY: 182 mg/dL — AB (ref 65–99)
GLUCOSE-CAPILLARY: 160 mg/dL — AB (ref 65–99)

## 2015-05-12 SURGERY — LEFT HEART CATH AND CORS/GRAFTS ANGIOGRAPHY
Anesthesia: LOCAL

## 2015-05-12 MED ORDER — ACETAMINOPHEN 325 MG PO TABS: 650.0000 mg | ORAL_TABLET | ORAL | Status: DC | PRN

## 2015-05-12 MED ORDER — SODIUM CHLORIDE 0.9 % WEIGHT BASED INFUSION
3.0000 mL/kg/h | INTRAVENOUS | Status: DC
  Administered 2015-05-12: 3 mL/kg/h via INTRAVENOUS

## 2015-05-12 MED ORDER — SODIUM CHLORIDE 0.9 % WEIGHT BASED INFUSION
1.0000 mL/kg/h | INTRAVENOUS | Status: DC
  Administered 2015-05-12: 1 mL/kg/h via INTRAVENOUS

## 2015-05-12 MED ORDER — HEPARIN (PORCINE) IN NACL 2-0.9 UNIT/ML-% IJ SOLN
INTRAMUSCULAR | Status: DC | PRN
  Administered 2015-05-12: 1000 mL

## 2015-05-12 MED ORDER — LIDOCAINE HCL (PF) 1 % IJ SOLN
INTRAMUSCULAR | Status: AC
  Filled 2015-05-12: qty 30

## 2015-05-12 MED ORDER — LIDOCAINE HCL (PF) 1 % IJ SOLN
INTRAMUSCULAR | Status: DC | PRN
Start: 2015-05-12 — End: 2015-05-12
  Administered 2015-05-12: 15 mL via INTRADERMAL

## 2015-05-12 MED ORDER — FENTANYL CITRATE (PF) 100 MCG/2ML IJ SOLN
INTRAMUSCULAR | Status: AC
  Filled 2015-05-12: qty 2

## 2015-05-12 MED ORDER — IOHEXOL 350 MG/ML SOLN
INTRAVENOUS | Status: DC | PRN
  Administered 2015-05-12: 105 mL via INTRA_ARTERIAL

## 2015-05-12 MED ORDER — SODIUM CHLORIDE 0.9 % IV SOLN: INTRAVENOUS | Status: AC

## 2015-05-12 MED ORDER — SODIUM CHLORIDE 0.9% FLUSH: 3.0000 mL | Freq: Two times a day (BID) | INTRAVENOUS | Status: DC

## 2015-05-12 MED ORDER — MIDAZOLAM HCL 2 MG/2ML IJ SOLN
INTRAMUSCULAR | Status: DC | PRN
  Administered 2015-05-12: 2 mg via INTRAVENOUS

## 2015-05-12 MED ORDER — SODIUM CHLORIDE 0.9 % IV SOLN: 250.0000 mL | INTRAVENOUS | Status: DC | PRN

## 2015-05-12 MED ORDER — ASPIRIN 81 MG PO CHEW: 81.0000 mg | CHEWABLE_TABLET | ORAL | Status: DC

## 2015-05-12 MED ORDER — MIDAZOLAM HCL 2 MG/2ML IJ SOLN
INTRAMUSCULAR | Status: AC
  Filled 2015-05-12: qty 2

## 2015-05-12 MED ORDER — HEPARIN (PORCINE) IN NACL 2-0.9 UNIT/ML-% IJ SOLN
INTRAMUSCULAR | Status: AC
  Filled 2015-05-12: qty 1000

## 2015-05-12 MED ORDER — SODIUM CHLORIDE 0.9% FLUSH: 3.0000 mL | INTRAVENOUS | Status: DC | PRN

## 2015-05-12 MED ORDER — FENTANYL CITRATE (PF) 100 MCG/2ML IJ SOLN
INTRAMUSCULAR | Status: DC | PRN
  Administered 2015-05-12: 50 ug via INTRAVENOUS

## 2015-05-12 MED ORDER — ONDANSETRON HCL 4 MG/2ML IJ SOLN: 4.0000 mg | Freq: Four times a day (QID) | INTRAMUSCULAR | Status: DC | PRN

## 2015-05-12 SURGICAL SUPPLY — 10 items
CATH INFINITI 5 FR IM (CATHETERS) ×1 IMPLANT
CATH INFINITI 5FR MULTPACK ANG (CATHETERS) ×1 IMPLANT
KIT HEART LEFT (KITS) ×2 IMPLANT
PACK CARDIAC CATHETERIZATION (CUSTOM PROCEDURE TRAY) ×2 IMPLANT
SHEATH PINNACLE 5F 10CM (SHEATH) ×1 IMPLANT
SYR MEDRAD MARK V 150ML (SYRINGE) ×2 IMPLANT
TRANSDUCER W/STOPCOCK (MISCELLANEOUS) ×2 IMPLANT
WIRE EMERALD 3MM-J .035X150CM (WIRE) ×1 IMPLANT
WIRE EMERALD 3MM-J .035X260CM (WIRE) ×1 IMPLANT
WIRE EMERALD ST .035X260CM (WIRE) IMPLANT

## 2015-05-12 NOTE — Discharge Instructions (Signed)
Angiogram, Care After °Refer to this sheet in the next few weeks. These instructions provide you with information about caring for yourself after your procedure. Your health care provider may also give you more specific instructions. Your treatment has been planned according to current medical practices, but problems sometimes occur. Call your health care provider if you have any problems or questions after your procedure. °WHAT TO EXPECT AFTER THE PROCEDURE °After your procedure, it is typical to have the following: °· Bruising at the catheter insertion site that usually fades within 1-2 weeks. °· Blood collecting in the tissue (hematoma) that may be painful to the touch. It should usually decrease in size and tenderness within 1-2 weeks. °HOME CARE INSTRUCTIONS °· Take medicines only as directed by your health care provider. °· You may shower 24-48 hours after the procedure or as directed by your health care provider. Remove the bandage (dressing) and gently wash the site with plain soap and water. Pat the area dry with a clean towel. Do not rub the site, because this may cause bleeding. °· Do not take baths, swim, or use a hot tub until your health care provider approves. °· Check your insertion site every day for redness, swelling, or drainage. °· Do not apply powder or lotion to the site. °· Do not lift over 10 lb (4.5 kg) for 5 days after your procedure or as directed by your health care provider. °· Ask your health care provider when it is okay to: °¨ Return to work or school. °¨ Resume usual physical activities or sports. °¨ Resume sexual activity. °· Do not drive home if you are discharged the same day as the procedure. Have someone else drive you. °· You may drive 24 hours after the procedure unless otherwise instructed by your health care provider. °· Do not operate machinery or power tools for 24 hours after the procedure or as directed by your health care provider. °· If your procedure was done as an  outpatient procedure, which means that you went home the same day as your procedure, a responsible adult should be with you for the first 24 hours after you arrive home. °· Keep all follow-up visits as directed by your health care provider. This is important. °SEEK MEDICAL CARE IF: °· You have a fever. °· You have chills. °· You have increased bleeding from the catheter insertion site. Hold pressure on the site and call 911. °SEEK IMMEDIATE MEDICAL CARE IF: °· You have unusual pain at the catheter insertion site. °· You have redness, warmth, or swelling at the catheter insertion site. °· You have drainage (other than a small amount of blood on the dressing) from the catheter insertion site. °· The catheter insertion site is bleeding, and the bleeding does not stop after 30 minutes of holding steady pressure on the site. °· The area near or just beyond the catheter insertion site becomes pale, cool, tingly, or numb. °  °This information is not intended to replace advice given to you by your health care provider. Make sure you discuss any questions you have with your health care provider. °  °Document Released: 10/01/2004 Document Revised: 04/05/2014 Document Reviewed: 08/16/2012 °Elsevier Interactive Patient Education ©2016 [employer] Inc. ° °

## 2015-05-12 NOTE — H&P (View-Only) (Signed)
Cardiology Office Note    Date:  05/09/2015   ID:  Nathan White, DOB 1950-07-19, MRN [id number]  PCP:  Quitman Livings, MD  Cardiologist:   Donato Schultz, MD     History of Present Illness:  Nathan White is a 65 y.o. male  Here for the evaluation of chest pain at the request of Dr. Sherene Sires. Previously had been referred to the pulmonary clinic in September 2016 because of shortness of breath/chest pain. Has a warm burning sensation in his chest, no wheezing, no cough. Per Dr. Thurston Hole note had had a negative cardiac workup. He has chest tightness that was reproducible with exertion more than 50 feet of walking almost every time but not always. A few times he has had a smother like sensation over the past couple months and sleeps in a recliner after taking  Xanax.  If lifts something burning. Had CAD, stents in [place]. Had CABG. In 8 at age 11 had MI. 12 years later had CABG. Dr. Arvilla Market. Left  Radial harvested.  In Jan 2016 - 2 blockages, one was opened and one could not be opened.   Grandson died from drunk driver. On motorcycle.    Dr. Richardine Service.     Past Medical History  Diagnosis Date  . High blood pressure   . Heart attack (HCC)   . Diabetes (HCC)   . High cholesterol     Past Surgical History  Procedure Laterality Date  . Gallbladder surgery    . Back surgery    . Cardiac bypass    . Stent placement vascular ([facility] hx)      Outpatient Prescriptions Prior to Visit  Medication Sig Dispense Refill  . ALPRAZolam (XANAX) 1 MG tablet Take 1 mg by mouth at bedtime as needed for anxiety.    . clopidogrel (PLAVIX) 75 MG tablet Take 75 mg by mouth daily.    . furosemide (LASIX) 40 MG tablet Take 80 mg by mouth daily.     Marland Kitchen glipiZIDE (GLUCOTROL) 10 MG tablet Take 10 mg by mouth daily before breakfast.     . hydrochlorothiazide (HYDRODIURIL) 25 MG tablet Take 25 mg by mouth every morning.    Marland Kitchen levothyroxine (SYNTHROID, LEVOTHROID) 100 MCG tablet Take 1 tablet by mouth  daily.    . metFORMIN (GLUCOPHAGE) 500 MG tablet Take 500 mg by mouth 2 (two) times daily with a meal.    . nitroGLYCERIN (NITROSTAT) 0.4 MG SL tablet Place 0.4 mg under the tongue daily as needed.    Marland Kitchen omeprazole (PRILOSEC) 20 MG capsule Take 1 capsule by mouth daily.    . pravastatin (PRAVACHOL) 80 MG tablet Take 80 mg by mouth daily.    . metoprolol succinate (TOPROL-XL) 50 MG 24 hr tablet Take 1/2 tablet by mouth following a meal.    . valsartan (DIOVAN) 80 MG tablet Take 1 tablet (80 mg total) by mouth daily. 30 tablet 11   No facility-administered medications prior to visit.     Allergies:   Review of patient's allergies indicates no known allergies.   Social History   Social History  . Marital Status: Married    Spouse Name: N/A  . Number of Children: N/A  . Years of Education: N/A   Occupational History  . retired    Social History Main Topics  . Smoking status: Former Smoker    Quit date: 07/31/1993  . Smokeless tobacco: None  . Alcohol Use: No  . Drug Use: None  . Sexual  Activity: Not Asked   Other Topics Concern  . None   Social History Narrative     Family History:  The patient's family history includes Cancer in his brother; Heart attack in his brother, brother, and mother.   ROS:   Please see the history of present illness.    ROS  Leg swelling, shortness of breath, shortness of breath with activity, depression, anxiety, wheezing, fatigue All other systems reviewed and are negative.   PHYSICAL EXAM:   VS:  BP 126/84 mmHg  Pulse 74  Ht 5\' 8"  (1.727 m)  Wt 279 lb (126.554 kg)  BMI 42.43 kg/m2   GEN: Well nourished, well developed, in no acute distress HEENT: normal Neck: no JVD, carotid bruits, or masses Cardiac: RRR; no murmurs, rubs, or gallops,no edema  Respiratory:  clear to auscultation bilaterally, normal work of breathing GI: soft, nontender, nondistended, + BS , obese MS: no deformity or atrophy Skin: warm and dry, no rash Neuro:  Alert  and Oriented x 3, Strength and sensation are intact Psych: euthymic mood, full affect  Wt Readings from Last 3 Encounters:  05/09/15 279 lb (126.554 kg)  03/21/15 283 lb (128.368 kg)  02/03/15 286 lb (129.729 kg)      Studies/Labs Reviewed:   EKG:  EKG is ordered today.  The ekg ordered today demonstrates  05/09/15 shows normal sinus rhythm, 75 with nonspecific ST-T wave changes, small Q waves in the inferior leads noted.  Recent Labs: No results found for requested labs within last 365 days.   Lipid Panel No results found for: CHOL, TRIG, HDL, CHOLHDL, VLDL, LDLCALC, LDLDIRECT  Additional studies/ records that were reviewed today include:  - spirometry 05/10/14 FEV1 (1.42) 44% ratio 84  - Cards eval in [facility] 11/14/14 [facility] (care everywhere) > rec lexiscan but not done  - trial off acei 12/23/2014 > marked improvement  - 03/21/2015 Walked RA x 3 laps @ 185 ft each stopped due to End of study, nl pace, no sob or desat But mild chest burning that resolved w/in a few min at rest  - PFT's 03/21/2015 FEV1 2.07 (63 % ) ratio 86 p 10 % improvement from saba with DLCO 69 % corrects to 120 % for alv volume and ERV 30%      ASSESSMENT:    1. Angina decubitus (HCC)   2. 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 Morbid obesity due to excess calories (HCC)   4. Hyperlipidemia   5. Pre-operative cardiovascular examination      PLAN:  In order of problems listed above:  1. Exertional angina-classic exertional anginal symptoms. We will start isosorbide 30 mg once a day. Continue with metoprolol. Last cardiac catheterization was in January 2016 at [place] by Dr. Garner Nash. He states that he went into the right radial however he does have prior bypass surgery from Dr. Arvilla Market in 200 2 and his left radial artery was harvested for the bypass. It is likely that he has a LIMA bypass graft. I explained the technical aspects of not  having the left radial approach option. May have to go femoral. We're obtaining records from the last heart catheterization. I have look through tear everywhere but I do not see the results of the cardiac catheterization. He is currently on Plavix. He states that in January, one stent was placed however another area could not be opened. He does  remember having relief after getting this stent  placed. Risks and benefits discussed ,, heart attack, bleeding. 2.  COPD-per Dr. Sherene Sires. ACE inhibitor was stopped, valsartan was started. 3.  Morbid obesity-continue to encourage weight loss. 4.  Hyperlipidemia-currently on pravastatin    Medication Adjustments/Labs and Tests Ordered: Current medicines are reviewed at length with the patient today.  Concerns regarding medicines are outlined above.  Medication changes, Labs and Tests ordered today are listed in the Patient Instructions below. Patient Instructions  Medication Instructions:  Please start Isosorbide 30 mg a day. Continue all other medications as listed.  Labwork: Please have blood work today (CBC, BMP and PT)  Testing/Procedures: Your physician has requested that you have a cardiac catheterization. Cardiac catheterization is used to diagnose and/or treat various heart conditions. Doctors may recommend this procedure for a number of different reasons. The most common reason is to evaluate chest pain. Chest pain can be a symptom of coronary artery disease (CAD), and cardiac catheterization can show whether plaque is narrowing or blocking your heart's arteries. This procedure is also used to evaluate the valves, as well as measure the blood flow and oxygen levels in different parts of your heart. For further information please visit [email or web address] Please follow instruction sheet, as given.  Follow-Up: Follow up in 1 month with Dr Anne Fu.  Thank you for choosing [facility]!!           Signed, Donato Schultz, MD    05/09/2015 9:32 AM    [facility] [address] [place], [place], [place]  [address] Phone: [phone]; Fax: [phone]

## 2015-05-12 NOTE — Progress Notes (Addendum)
Site area: rt groin Site Prior to Removal:  Level 0 Pressure Applied For:  20 minutes Manual:   yes Patient Status During Pull:  stable Post Pull Site:  Level  0 Post Pull Instructions Given:  yes Post Pull Pulses Present: yes Dressing Applied:  tegaderm Bedrest begins @  0905 Comments:   

## 2015-05-12 NOTE — Telephone Encounter (Signed)
Records received from [facility] placed in [facility] chart prep bin.

## 2015-05-12 NOTE — Interval H&P Note (Signed)
Cath Lab Visit (complete for each Cath Lab visit)  Clinical Evaluation Leading to the Procedure:   ACS: No.  Non-ACS:    Anginal Classification: CCS III  Anti-ischemic medical therapy: Maximal Therapy (2 or more classes of medications)  Non-Invasive Test Results: No non-invasive testing performed  Prior CABG: Previous CABG      History and Physical Interval Note:  05/12/2015 7:45 AM  Nathan White  has presented today for surgery, with the diagnosis of CAD, cp  The various methods of treatment have been discussed with the patient and family. After consideration of risks, benefits and other options for treatment, the patient has consented to  Procedure(s): Left Heart Cath and Cors/Grafts Angiography (N/A) as a surgical intervention .  The patient's history has been reviewed, patient examined, no change in status, stable for surgery.  I have reviewed the patient's chart and labs.  Questions were answered to the patient's satisfaction.     KELLY,THOMAS A

## 2015-05-13 ENCOUNTER — Encounter (HOSPITAL_COMMUNITY): Payer: Self-pay | Admitting: Cardiovascular Disease

## 2015-06-10 ENCOUNTER — Ambulatory Visit: Payer: Medicare Other | Admitting: Cardiology

## 2015-06-20 ENCOUNTER — Ambulatory Visit (INDEPENDENT_AMBULATORY_CARE_PROVIDER_SITE_OTHER): Payer: Medicare Other | Admitting: Cardiology

## 2015-06-20 ENCOUNTER — Encounter: Payer: Self-pay | Admitting: Cardiology

## 2015-06-20 VITALS — BP 86/58 | HR 71 | Ht 68.0 in | Wt 281.1 lb

## 2015-06-20 DIAGNOSIS — I9589 Other hypotension: Secondary | ICD-10-CM

## 2015-06-20 DIAGNOSIS — Z79899 Other long term (current) drug therapy: Secondary | ICD-10-CM | POA: Diagnosis not present

## 2015-06-20 DIAGNOSIS — I25708 Atherosclerosis of coronary artery bypass graft(s), unspecified, with other forms of angina pectoris: Secondary | ICD-10-CM | POA: Diagnosis not present

## 2015-06-20 DIAGNOSIS — I208 Other forms of angina pectoris: Secondary | ICD-10-CM | POA: Diagnosis not present

## 2015-06-20 DIAGNOSIS — E785 Hyperlipidemia, unspecified: Secondary | ICD-10-CM

## 2015-06-20 LAB — CBC
HEMATOCRIT: 44.5 % (ref 39.0–52.0)
Hemoglobin: 15.3 g/dL (ref 13.0–17.0)
MCH: 30.9 pg (ref 26.0–34.0)
MCHC: 34.4 g/dL (ref 30.0–36.0)
MCV: 89.9 fL (ref 78.0–100.0)
MPV: 10.6 fL (ref 8.6–12.4)
Platelets: 223 10*3/uL (ref 150–400)
RBC: 4.95 MIL/uL (ref 4.22–5.81)
RDW: 14 % (ref 11.5–15.5)
WBC: 11.1 10*3/uL — AB (ref 4.0–10.5)

## 2015-06-20 LAB — BASIC METABOLIC PANEL
BUN: 25 mg/dL (ref 7–25)
CALCIUM: 9.4 mg/dL (ref 8.6–10.3)
CO2: 33 mmol/L — AB (ref 20–31)
Chloride: 94 mmol/L — ABNORMAL LOW (ref 98–110)
Creat: 1.14 mg/dL (ref 0.70–1.25)
GLUCOSE: 177 mg/dL — AB (ref 65–99)
Potassium: 3.6 mmol/L (ref 3.5–5.3)
Sodium: 136 mmol/L (ref 135–146)

## 2015-06-20 NOTE — Patient Instructions (Signed)
Medication Instructions:  Please stop HCTZ and Imdur. Hold Furosemide for 3 days then restart at 40 mg once a day. Continue all other medications as listed.  Labwork: Please have blood work today (CBC and BMP)  Follow-Up: Follow up in 2 weeks with Dr Anne FuSkains.  If you need a refill on your cardiac medications before your next appointment, please call your pharmacy.  Thank you for choosing [facility]!!

## 2015-06-20 NOTE — Progress Notes (Signed)
Cardiology Office Note    Date:  06/20/2015   ID:  Nathan ReCarl Dilday, DOB 01/25/1951, MRN [id number]  PCP:  Quitman LivingsHASSAN,SAMI, MD  Cardiologist:   Donato SchultzSKAINS, Damier Disano, MD     History of Present Illness:  Nathan White is a 65 y.o. male  Here for the evaluation of chest pain at the request of Dr. Sherene SiresWert. Previously had been referred to the pulmonary clinic in September 2016 because of shortness of breath/chest pain. Has a warm burning sensation in his chest, no wheezing, no cough. Per Dr. Thurston HoleWert's note had had a negative cardiac workup. He has chest tightness that was reproducible with exertion more than 50 feet of walking almost every time but not always. A few times he has had a smother like sensation over the past couple months and sleeps in a recliner after taking  Xanax.  If lifts something burning. Had CAD, stents in [place]. Had CABG. In [id number] at age 65 had MI. 12 years later had CABG. Dr. Arvilla MarketMills. Left  Radial harvested.  In Jan 2016 - 2 blockages, one was opened and one could not be opened.   Grandson died from drunk driver. On motorcycle.    Dr. Richardine Serviceaniels [place].     Past Medical History  Diagnosis Date  . High blood pressure   . Heart attack (HCC)   . Diabetes (HCC)   . High cholesterol     Past Surgical History  Procedure Laterality Date  . Gallbladder surgery    . Back surgery    . Cardiac bypass    . Stent placement vascular ([facility] hx)    . Cardiac catheterization N/A 05/12/2015    Procedure: Left Heart Cath and Cors/Grafts Angiography;  Surgeon: Lennette Biharihomas A Kelly, MD;  [facility];  Service: Cardiovascular;  Laterality: N/A;    Outpatient Prescriptions Prior to Visit  Medication Sig Dispense Refill  . ALPRAZolam (XANAX) 1 MG tablet Take 1 mg by mouth at bedtime as needed for anxiety.    Marland Kitchen. aspirin 81 MG tablet Take 81 mg by mouth daily.    . clopidogrel (PLAVIX) 75 MG tablet Take 75 mg by mouth daily.    . fluticasone (FLONASE) 50 MCG/ACT nasal spray Place 1  spray into both nostrils daily as needed for allergies.     . furosemide (LASIX) 40 MG tablet Take 40 mg by mouth daily.    Marland Kitchen. glipiZIDE (GLUCOTROL) 10 MG tablet Take 10 mg by mouth daily before breakfast.     . levothyroxine (SYNTHROID, LEVOTHROID) 100 MCG tablet Take 1 tablet by mouth daily.    . metFORMIN (GLUCOPHAGE) 500 MG tablet Take 500 mg by mouth 2 (two) times daily with a meal.    . metoprolol (LOPRESSOR) 50 MG tablet Take 50 mg by mouth daily.     . nitroGLYCERIN (NITROSTAT) 0.4 MG SL tablet Place 0.4 mg under the tongue daily as needed for chest pain.     Marland Kitchen. omeprazole (PRILOSEC) 20 MG capsule Take 20 mg by mouth daily.     . pravastatin (PRAVACHOL) 80 MG tablet Take 40 mg by mouth daily.     . valsartan (DIOVAN) 80 MG tablet Take 40 mg by mouth daily.    . hydrochlorothiazide (HYDRODIURIL) 25 MG tablet Take 25 mg by mouth every morning.    . isosorbide mononitrate (IMDUR) 30 MG 24 hr tablet Take 1 tablet (30 mg total) by mouth daily. 30 tablet 3   No facility-administered medications prior to visit.  Allergies:   Review of patient's allergies indicates no known allergies.   Social History   Social History  . Marital Status: Married    Spouse Name: N/A  . Number of Children: N/A  . Years of Education: N/A   Occupational History  . retired    Social History Main Topics  . Smoking status: Former Smoker    Quit date: 07/31/1993  . Smokeless tobacco: None  . Alcohol Use: No  . Drug Use: None  . Sexual Activity: Not Asked   Other Topics Concern  . None   Social History Narrative     Family History:  The patient's family history includes Cancer in his brother; Heart attack in his brother, brother, and mother.   ROS:   Please see the history of present illness.    ROS  Leg swelling, shortness of breath, shortness of breath with activity, depression, anxiety, wheezing, fatigue All other systems reviewed and are negative.   PHYSICAL EXAM:   VS:  BP 86/58 mmHg   Pulse 71  Ht  (1.727 m)  Wt 281 lb 2 oz (127.517 kg)  BMI 42.75 kg/m2  SpO2 91%   GEN: Well nourished, well developed, in no acute distress HEENT: normal Neck: no JVD, carotid bruits, or masses Cardiac: RRR; no murmurs, rubs, or gallops,no edema  Respiratory:  clear to auscultation bilaterally, normal work of breathing GI: soft, nontender, nondistended, + BS , obese MS: no deformity or atrophy Skin: warm and dry, no rash Neuro:  Alert and Oriented x 3, Strength and sensation are intact Psych: euthymic mood, full affect  Wt Readings from Last 3 Encounters:  06/20/15 281 lb 2 oz (127.517 kg)  05/12/15 279 lb (126.554 kg)  05/09/15 279 lb (126.554 kg)      Studies/Labs Reviewed:   EKG:  EKG is ordered today.  The ekg ordered today demonstrates  05/09/15 shows normal sinus rhythm, 75 with nonspecific ST-T wave changes, small Q waves in the inferior leads noted.  Recent Labs: 05/09/2015: BUN 17; Creat 1.06; Hemoglobin 16.1; Platelets 206; Potassium 3.7; Sodium 135   Lipid Panel No results found for: CHOL, TRIG, HDL, CHOLHDL, VLDL, LDLCALC, LDLDIRECT  Additional studies/ records that were reviewed today include:  - spirometry 05/10/14 FEV1 (1.42) 44% ratio 84  - Cards eval in [facility] 11/14/14 [facility] (care everywhere) > rec lexiscan but not done  - trial off acei 12/23/2014 > marked improvement  - 03/21/2015 Walked RA x 3 laps @ 185 ft each stopped due to End of study, nl pace, no sob or desat But mild chest burning that resolved w/in a few min at rest  - PFT's 03/21/2015 FEV1 2.07 (63 % ) ratio 86 p 10 % improvement from saba with DLCO 69 % corrects to 120 % for alv volume and ERV 30%   Cardiac Cath 05/12/15:  Prox RCA lesion, 100% stenosed.  Mid LAD lesion, 100% stenosed.  Prox Cx to Mid Cx lesion, 100% stenosed.  LIMA was injected is normal in caliber, and is anatomically normal.  Patent LIMA graft which supplies the mid LAD  Radial artery was injected is normal  in caliber, and is anatomically normal.  Patent radial artery graft supplying the very high first marginal (almost ramus intermediate, like vessel)  SVG .  There is severe disease in the graft.  Occluded vein graft prior to the proximally stented segment in the graft that supplied the RCA  Origin to Prox Graft lesion, 100% stenosed. The lesion was  previously treated with a stent (unknown type).  SVG was injected is normal in caliber.  There is severe disease in the graft.  Occluded vein graft with that supplied the third marginal branch  Origin lesion, 100% stenosed.  There is moderate to severe left ventricular systolic dysfunction.  Moderately severe global LV dysfunction with diffuse hypocontractility and an ejection fraction of 30-35%.  Severe native CAD with total occlusion of the proximal LAD after the first septal and diagonal vessel; total occlusion of the proximal circumflex; and total occlusion of the proximal RCA. Faint collaterals arising from the septal perforating artery supplying a portion of the distal RCA.  Patent LIMA graft supplying the mid LAD.  Patent radial artery graft supplying a high marginal branch of the left circumflex coronary artery.  Occluded vein graft that supplied a distal marginal vessel.  Occluded vein graft proximal to a previously stented portion in the very proximal graft which had supplied the distal RCA.  RECOMMENDATION: Increased medical therapy. Consider the addition of her ranolazine in addition to nitrates, beta blocker, and possible amlodipine.   ASSESSMENT:    1. Other specified hypotension   2. Encounter for long-term (current) use of medications      PLAN:  In order of problems listed above:  Severe coronary artery disease status post bypass with exertional angina -Cardiac catheterization reviewed, severe results. LIMA to LAD is patent, SVG to circumflex is patent. Other SVG grafts are occluded, previously stented  RCA graft is occluded. OM 3 graft is occluded. EF 30-35%.  His blood pressure is quite low he feels washed out, terrible. He has been on both HCTZ as well as Lasix 80 mg. I will discontinue the HCTZ and hold his Lasix for 3 days to allow him to equilibrate. After 3 days, restart 40 mg of Lasix daily. Continue with low-dose Diovan. Continue with metoprolol 50. I wonder if he would be able to tolerate low-dose interest in the future.  I will also discontinue the newly started isosorbide. We will start over and see what he can tolerate.  He remains quite active. Still goes to the job. He is not one just to sit around. Obviously frustrated with his symptoms.  Exertional angina-classic exertional anginal symptoms.   Dilated cardiomyopathy -As above. Medications adjusted.   COPD-per Dr. Sherene Sires. ACE inhibitor was stopped, valsartan was started.  Morbid obesity-continue to encourage weight loss.  Hyperlipidemia-currently on pravastatin    Medication Adjustments/Labs and Tests Ordered: Current medicines are reviewed at length with the patient today.  Concerns regarding medicines are outlined above.  Medication changes, Labs and Tests ordered today are listed in the Patient Instructions below. Patient Instructions  Medication Instructions:  Please stop HCTZ and Imdur. Hold Furosemide for 3 days then restart at 40 mg once a day. Continue all other medications as listed.  Labwork: Please have blood work today (CBC and BMP)  Follow-Up: Follow up in 2 weeks with Dr Anne Fu.  If you need a refill on your cardiac medications before your next appointment, please call your pharmacy.  Thank you for choosing [facility]!!            Signed, Donato Schultz, MD  06/20/2015 9:22 AM    [facility] [address] [place], [place], [place]  [address] Phone: [phone]; Fax: [phone]

## 2015-06-30 ENCOUNTER — Encounter: Payer: Self-pay | Admitting: Cardiology

## 2015-06-30 ENCOUNTER — Ambulatory Visit (INDEPENDENT_AMBULATORY_CARE_PROVIDER_SITE_OTHER): Payer: Medicare Other | Admitting: Cardiology

## 2015-06-30 VITALS — BP 120/68 | HR 70 | Ht 68.0 in | Wt 286.0 lb

## 2015-06-30 DIAGNOSIS — I25708 Atherosclerosis of coronary artery bypass graft(s), unspecified, with other forms of angina pectoris: Secondary | ICD-10-CM | POA: Diagnosis not present

## 2015-06-30 DIAGNOSIS — E785 Hyperlipidemia, unspecified: Secondary | ICD-10-CM | POA: Diagnosis not present

## 2015-06-30 DIAGNOSIS — I208 Other forms of angina pectoris: Secondary | ICD-10-CM

## 2015-06-30 DIAGNOSIS — I5022 Chronic systolic (congestive) heart failure: Secondary | ICD-10-CM

## 2015-06-30 MED ORDER — CARVEDILOL 6.25 MG PO TABS: 6.2500 mg | ORAL_TABLET | Freq: Two times a day (BID) | ORAL | Status: AC

## 2015-06-30 NOTE — Progress Notes (Signed)
Cardiology Office Note    Date:  06/30/2015   ID:  Nathan ReCarl Faircloth, DOB 05/28/1950, MRN [id number]  PCP:  Quitman LivingsHASSAN,SAMI, MD  Cardiologist:   Donato SchultzSKAINS, MARK, MD     History of Present Illness:  Nathan White is a 65 y.o. male  Here for the evaluation of chest pain at the request of Dr. Sherene SiresWert. Previously had been referred to the pulmonary clinic in September 2016 because of shortness of breath/chest pain. Has a warm burning sensation in his chest, no wheezing, no cough. Per Dr. Thurston HoleWert's note had had a negative cardiac workup. He has chest tightness that was reproducible with exertion more than 50 feet of walking almost every time but not always. A few times he has had a smother like sensation over the past couple months and sleeps in a recliner after taking  Xanax.  If lifts something burning. Had CAD, stents in [place]. Had CABG. In [id number] at age 65 had MI. 12 years later had CABG. Dr. Arvilla MarketMills. Left  Radial harvested.  In Jan 2016 - 2 blockages, one was opened and one could not be opened.   Grandson died from drunk driver. On motorcycle.    Dr. Richardine Serviceaniels [place].   06/30/15-overall he is doing better after we discontinued his HCTZ, Imdur, decreased his Lasix. Blood pressure prior visit was quite low and he felt "washed out". No chest pain, no syncope, no bleeding. Reviewed cardiac catheterization once again.    Past Medical History  Diagnosis Date  . High blood pressure   . Heart attack (HCC)   . Diabetes (HCC)   . High cholesterol     Past Surgical History  Procedure Laterality Date  . Gallbladder surgery    . Back surgery    . Cardiac bypass    . Stent placement vascular ([facility] hx)    . Cardiac catheterization N/A 05/12/2015    Procedure: Left Heart Cath and Cors/Grafts Angiography;  Surgeon: Lennette Biharihomas A Kelly, MD;  [facility];  Service: Cardiovascular;  Laterality: N/A;    Outpatient Prescriptions Prior to Visit  Medication Sig Dispense Refill  . ALPRAZolam (XANAX) 1  MG tablet Take 1 mg by mouth at bedtime as needed for anxiety.    Marland Kitchen. aspirin 81 MG tablet Take 81 mg by mouth daily.    . clopidogrel (PLAVIX) 75 MG tablet Take 75 mg by mouth daily.    . fluticasone (FLONASE) 50 MCG/ACT nasal spray Place 1 spray into both nostrils daily as needed for allergies.     . furosemide (LASIX) 40 MG tablet Take 40 mg by mouth daily.    Marland Kitchen. glipiZIDE (GLUCOTROL) 10 MG tablet Take 10 mg by mouth daily before breakfast.     . levothyroxine (SYNTHROID, LEVOTHROID) 100 MCG tablet Take 1 tablet by mouth daily.    . metFORMIN (GLUCOPHAGE) 500 MG tablet Take 500 mg by mouth 2 (two) times daily with a meal.    . metoprolol (LOPRESSOR) 50 MG tablet Take 50 mg by mouth daily.     . nitroGLYCERIN (NITROSTAT) 0.4 MG SL tablet Place 0.4 mg under the tongue daily as needed for chest pain.     Marland Kitchen. omeprazole (PRILOSEC) 20 MG capsule Take 20 mg by mouth daily.     . pravastatin (PRAVACHOL) 80 MG tablet Take 40 mg by mouth daily.     . valsartan (DIOVAN) 80 MG tablet Take 40 mg by mouth daily.     No facility-administered medications prior to visit.  Allergies:   Review of patient's allergies indicates no known allergies.   Social History   Social History  . Marital Status: Married    Spouse Name: N/A  . Number of Children: N/A  . Years of Education: N/A   Occupational History  . retired    Social History Main Topics  . Smoking status: Former Smoker    Quit date: 07/31/1993  . Smokeless tobacco: None  . Alcohol Use: No  . Drug Use: None  . Sexual Activity: Not Asked   Other Topics Concern  . None   Social History Narrative     Family History:  The patient's family history includes Cancer in his brother; Heart attack in his brother, brother, and mother.   ROS:   Please see the history of present illness.    ROS  Leg swelling, shortness of breath, shortness of breath with activity, depression, anxiety, wheezing, fatigue All other systems reviewed and are  negative.   PHYSICAL EXAM:   VS:  BP 120/68 mmHg  Pulse 70  Ht  (1.727 m)  Wt 286 lb (129.729 kg)  BMI 43.50 kg/m2   GEN: Well nourished, well developed, in no acute distress HEENT: normal Neck: no JVD, carotid bruits, or masses Cardiac: RRR; no murmurs, rubs, or gallops,no edema  Respiratory:  clear to auscultation bilaterally, normal work of breathing GI: soft, nontender, nondistended, + BS , obese MS: no deformity or atrophy Skin: warm and dry, no rash Neuro:  Alert and Oriented x 3, Strength and sensation are intact Psych: euthymic mood, full affect  Wt Readings from Last 3 Encounters:  06/30/15 286 lb (129.729 kg)  06/20/15 281 lb 2 oz (127.517 kg)  05/12/15 279 lb (126.554 kg)      Studies/Labs Reviewed:   EKG:  EKG is ordered today.  The ekg ordered today demonstrates  05/09/15 shows normal sinus rhythm, 75 with nonspecific ST-T wave changes, small Q waves in the inferior leads noted.  Recent Labs: 06/20/2015: BUN 25; Creat 1.14; Hemoglobin 15.3; Platelets 223; Potassium 3.6; Sodium 136   Lipid Panel No results found for: CHOL, TRIG, HDL, CHOLHDL, VLDL, LDLCALC, LDLDIRECT  Additional studies/ records that were reviewed today include:  - spirometry 05/10/14 FEV1 (1.42) 44% ratio 84  - Cards eval in [facility] 11/14/14 [facility] (care everywhere) > rec lexiscan but not done  - trial off acei 12/23/2014 > marked improvement  - 03/21/2015 Walked RA x 3 laps @ 185 ft each stopped due to End of study, nl pace, no sob or desat But mild chest burning that resolved w/in a few min at rest  - PFT's 03/21/2015 FEV1 2.07 (63 % ) ratio 86 p 10 % improvement from saba with DLCO 69 % corrects to 120 % for alv volume and ERV 30%   Cardiac Cath 05/12/15:  Prox RCA lesion, 100% stenosed.  Mid LAD lesion, 100% stenosed.  Prox Cx to Mid Cx lesion, 100% stenosed.  LIMA was injected is normal in caliber, and is anatomically normal.  Patent LIMA graft which supplies the mid  LAD  Radial artery was injected is normal in caliber, and is anatomically normal.  Patent radial artery graft supplying the very high first marginal (almost ramus intermediate, like vessel)  SVG .  There is severe disease in the graft.  Occluded vein graft prior to the proximally stented segment in the graft that supplied the RCA  Origin to Prox Graft lesion, 100% stenosed. The lesion was previously treated with a stent (  unknown type).  SVG was injected is normal in caliber.  There is severe disease in the graft.  Occluded vein graft with that supplied the third marginal branch  Origin lesion, 100% stenosed.  There is moderate to severe left ventricular systolic dysfunction.  Moderately severe global LV dysfunction with diffuse hypocontractility and an ejection fraction of 30-35%.  Severe native CAD with total occlusion of the proximal LAD after the first septal and diagonal vessel; total occlusion of the proximal circumflex; and total occlusion of the proximal RCA. Faint collaterals arising from the septal perforating artery supplying a portion of the distal RCA.  Patent LIMA graft supplying the mid LAD.  Patent radial artery graft supplying a high marginal branch of the left circumflex coronary artery.  Occluded vein graft that supplied a distal marginal vessel.  Occluded vein graft proximal to a previously stented portion in the very proximal graft which had supplied the distal RCA.  RECOMMENDATION: Increased medical therapy. Consider the addition of her ranolazine in addition to nitrates, beta blocker, and possible amlodipine.   ASSESSMENT:    No diagnosis found.   PLAN:  In order of problems listed above:  Severe coronary artery disease status post bypass with exertional angina -Cardiac catheterization reviewed, severe results. LIMA to LAD is patent, SVG to circumflex is patent. Other SVG grafts are occluded, previously stented RCA graft is occluded. OM 3  graft is occluded. EF 30-35%.  At prior visit his blood pressure was very low and we decided to discontinue his hydrochlorothiazide, decrease his Lasix to 40 mg a day and stop his isosorbide. He is feeling much better. He is actually able to walk further distances without any complaints.Continue with low-dose Diovan. I am changing his metoprolol 50 mg over to carvedilol 6.25 mg twice a day. This may be more beneficial given his heart failure.  He could be a candidate in the future for Entresto, however he has battled with hypotension in the past.  He remains quite active. Still goes to the job. He is not one just to sit around.   Exertional angina -classic exertional anginal symptoms. Improved currently  Dilated cardiomyopathy -As above. Medications adjusted.   COPD -per Dr. Sherene Sires. ACE inhibitor was stopped, valsartan was started.  Morbid obesity -continue to encourage weight loss.  Hyperlipidemia -currently on pravastatin    Medication Adjustments/Labs and Tests Ordered: Current medicines are reviewed at length with the patient today.  Concerns regarding medicines are outlined above.  Medication changes, Labs and Tests ordered today are listed in the Patient Instructions below. There are no Patient Instructions on file for this visit.     Mathews Robinsons, MD  06/30/2015 10:58 AM    [facility] [address] [place], [place], [place]  [address] Phone: [phone]; Fax: [phone]

## 2015-06-30 NOTE — Patient Instructions (Signed)
Medication Instructions:  Please stop Metoprolol and start Carvedilol 6.25 mg twice a day. Continue all other medications as listed.  Follow-Up: Follow up in 4 months with Dr Anne FuSkains.  If you need a refill on your cardiac medications before your next appointment, please call your pharmacy.  Thank you for choosing [facility]!!

## 2015-07-17 DIAGNOSIS — S90821A Blister (nonthermal), right foot, initial encounter: Secondary | ICD-10-CM | POA: Insufficient documentation

## 2015-09-18 ENCOUNTER — Telehealth: Payer: Self-pay | Admitting: Cardiology

## 2015-09-18 NOTE — Telephone Encounter (Signed)
Addendum- appt was made for 7/6 w/ Wynema BirchHao

## 2015-09-18 NOTE — Telephone Encounter (Signed)
New Message  Pt c/o Shortness Of Breath: STAT if SOB developed within the last 24 hours or pt is noticeably SOB on the phone  1. Are you currently SOB (can you hear that pt is SOB on the phone)? no  2. How long have you been experiencing SOB? For a while, worst last night   3. Are you SOB when sitting or when up moving around? Both, moving around, after laying down last night, he had trouble breathing   4. Are you currently experiencing any other symptoms? no

## 2015-09-18 NOTE — Telephone Encounter (Signed)
Called patient's spouse and informed her of Dr. Anne FuSkains' advisement. Per Dr. Anne FuSkains, take Lasix 40 mg twice a day for the next 3 days. Go back to 40 mg once a day after this. Agree with dietary restrictions. Patient's spouse verbalized understanding and will call with any other questions or concerns.

## 2015-09-18 NOTE — Telephone Encounter (Signed)
Called patient's spouse back about her message. Patient complained of SOB last night after a heavy meal. Patient has some swelling in BLE. Patient has not been taking his omeprazole, and has not been watching his intake of salt. Patient's spouse reported that patient has been drinking nothing but soda. Informed patient's wife that patient should stop drinking soda, reduce his salt intake, and take his omeprazole. Will forward to Dr. Anne FuSkains for further advisement. Patient has an appointment with PA 10/02/15.

## 2015-09-18 NOTE — Telephone Encounter (Signed)
Take Lasix 40 mg twice a day for the next 3 days. Go back to 40 mg once a day after this. Agree with dietary restrictions.  Donato SchultzMark Skains, MD

## 2015-10-02 ENCOUNTER — Ambulatory Visit: Payer: Medicare Other | Admitting: Physician Assistant

## 2015-10-08 ENCOUNTER — Encounter: Payer: Self-pay | Admitting: Cardiology

## 2015-11-04 ENCOUNTER — Ambulatory Visit: Payer: Medicare Other | Admitting: Cardiology

## 2015-11-12 IMAGING — CR DG CHEST 2V
2 series · 2 of 2 positions shown · non-contrast
Comparison: CT chest 05/06/2014 and chest radiograph 04/01/2014.

CLINICAL DATA: Shortness of breath on exertion for 6 months,
burning sensation, cough.

EXAM:
CHEST  2 VIEW

[view not recorded (1 of 2)]
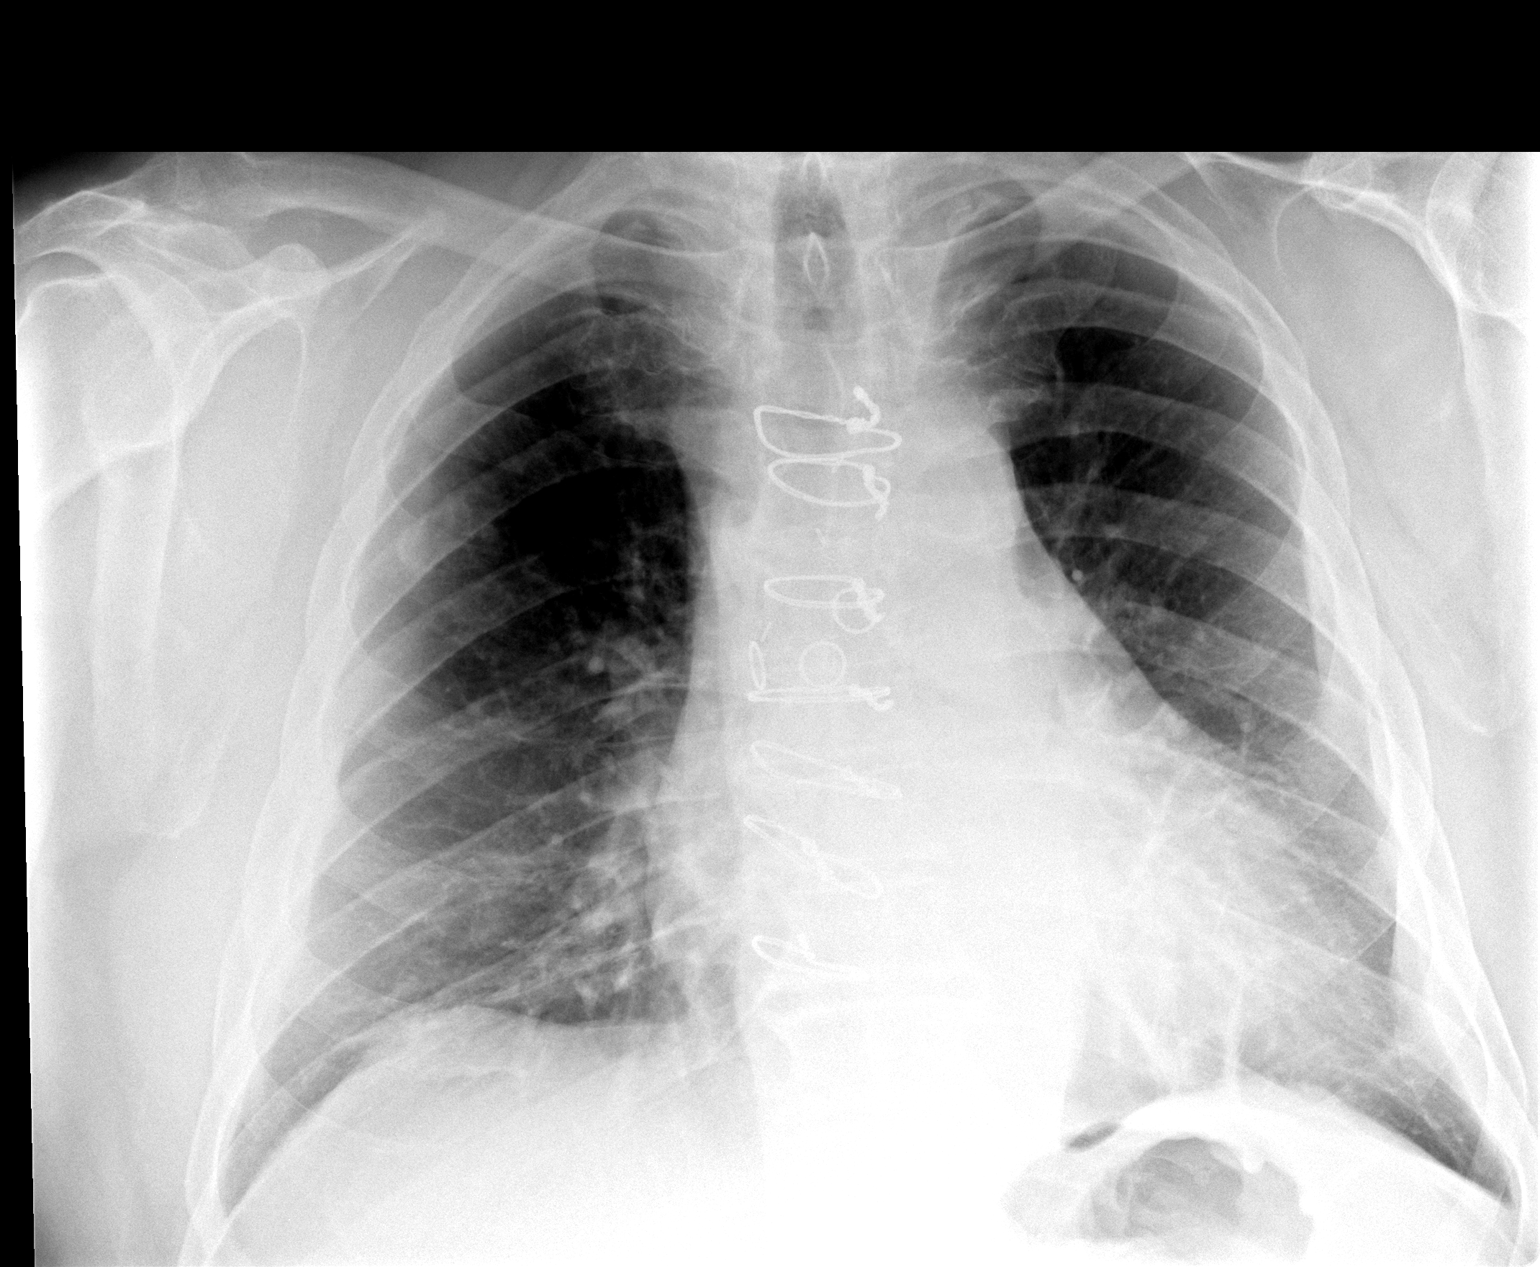

[view not recorded (2 of 2)]
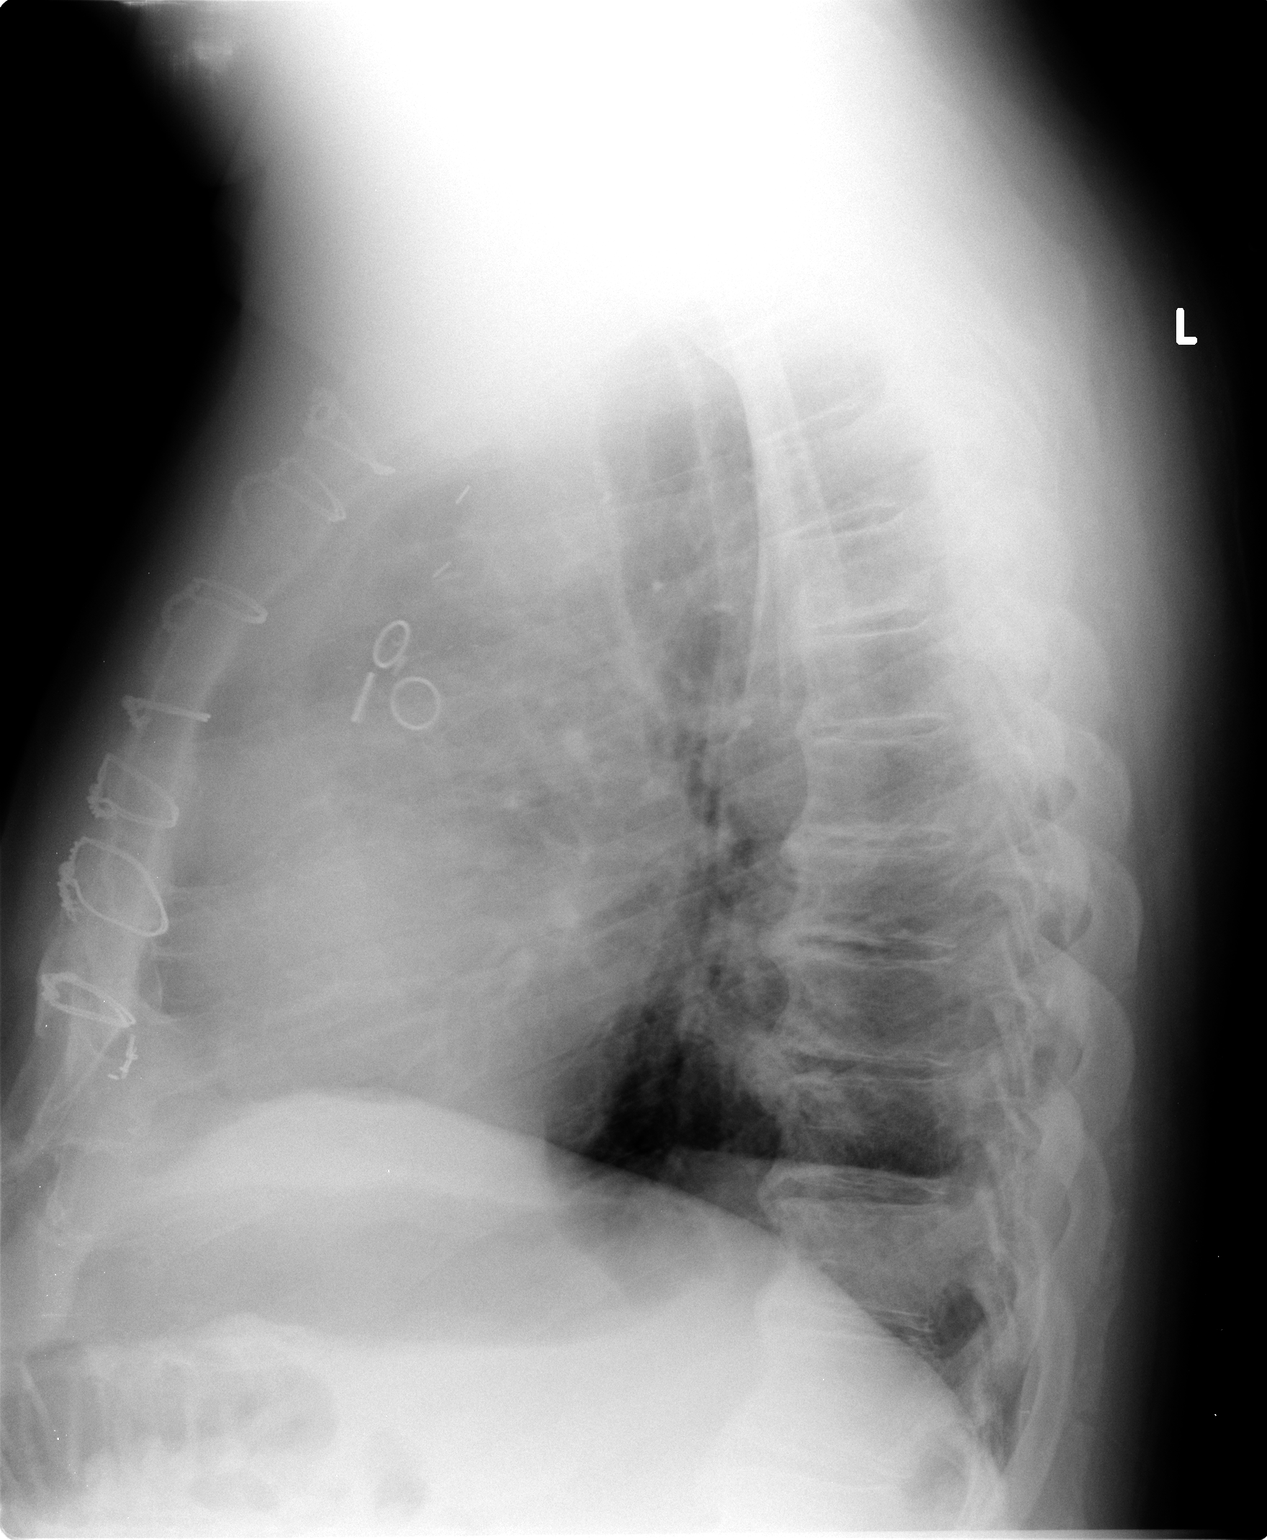

[2 of 2 positions shown; findings below may reference images not displayed]

FINDINGS: Trachea is midline. Heart is at the upper limits of normal in size
to mildly enlarged. Mild pleural parenchymal scarring at the base of
the left hemi thorax, stable. Calcified pleural plaques on the
right. No pleural fluid. Flowing anterior osteophytosis in the
thoracic spine.
IMPRESSION: 1. No acute findings.
2. Pleural parenchymal scarring at the base of the left hemi thorax.

## 2015-11-19 ENCOUNTER — Encounter (INDEPENDENT_AMBULATORY_CARE_PROVIDER_SITE_OTHER): Payer: Self-pay

## 2015-11-19 ENCOUNTER — Ambulatory Visit (INDEPENDENT_AMBULATORY_CARE_PROVIDER_SITE_OTHER): Payer: Medicare Other | Admitting: Cardiology

## 2015-11-19 ENCOUNTER — Encounter: Payer: Self-pay | Admitting: Cardiology

## 2015-11-19 VITALS — BP 118/76 | HR 68 | Ht 68.0 in | Wt 286.8 lb

## 2015-11-19 DIAGNOSIS — I208 Other forms of angina pectoris: Secondary | ICD-10-CM | POA: Diagnosis not present

## 2015-11-19 DIAGNOSIS — I5022 Chronic systolic (congestive) heart failure: Secondary | ICD-10-CM

## 2015-11-19 NOTE — Progress Notes (Signed)
Cardiology Office Note    Date:  11/19/2015   ID:  Nathan White, DOB 03/30/1950, MRN [id number]  PCP:  Quitman LivingsHASSAN,SAMI, MD  Cardiologist:   Donato SchultzMark Teana Lindahl, MD     History of Present Illness:  Nathan White is a 65 y.o. male  Here for the evaluation of chest pain at the request of Dr. Sherene SiresWert. Previously had been referred to the pulmonary clinic in September 2016 because of shortness of breath/chest pain. Has a warm burning sensation in his chest, no wheezing, no cough. Per Dr. Thurston HoleWert's note had had a negative cardiac workup. He has chest tightness that was reproducible with exertion more than 50 feet of walking almost every time but not always. A few times he has had a smother like sensation over the past couple months and sleeps in a recliner after taking  Xanax.  If lifts something burning. Had CAD, stents in [place]. Had CABG. In [address]1990 at age 65 had MI. 12 years later had CABG. Dr. Arvilla MarketMills. Left  Radial harvested.  In Jan 2016 - 2 blockages, one was opened and one could not be opened.   Grandson died from drunk driver. On motorcycle.    Dr. Richardine Serviceaniels [place].   06/30/15-overall he is doing better after we discontinued his HCTZ, Imdur, decreased his Lasix. Blood pressure prior visit was quite low and he felt "washed out". No chest pain, no syncope, no bleeding. Reviewed cardiac catheterization once again.  11/19/15-other than shortness of breath and decreased energy, no other complaints. We discussed weight loss again. No chest pain. Just shortness of breath.  Past Medical History:  Diagnosis Date  . Diabetes (HCC)   . Heart attack (HCC)   . High blood pressure   . High cholesterol     Past Surgical History:  Procedure Laterality Date  . BACK SURGERY    . Cardiac bypass    . CARDIAC CATHETERIZATION N/A 05/12/2015   Procedure: Left Heart Cath and Cors/Grafts Angiography;  Surgeon: Lennette Biharihomas A Kelly, MD;  [facility];  Service: Cardiovascular;  Laterality: N/A;  . GALLBLADDER  SURGERY    . STENT PLACEMENT VASCULAR ([facility] HX)      Outpatient Medications Prior to Visit  Medication Sig Dispense Refill  . ALPRAZolam (XANAX) 1 MG tablet Take 1 mg by mouth at bedtime as needed for anxiety.    Marland Kitchen. aspirin 81 MG tablet Take 81 mg by mouth daily.    . carvedilol (COREG) 6.25 MG tablet Take 1 tablet (6.25 mg total) by mouth 2 (two) times daily. 180 tablet 3  . clopidogrel (PLAVIX) 75 MG tablet Take 75 mg by mouth daily.    . fluticasone (FLONASE) 50 MCG/ACT nasal spray Place 1 spray into both nostrils daily as needed for allergies.     . furosemide (LASIX) 40 MG tablet Take 40 mg by mouth daily.    Marland Kitchen. glipiZIDE (GLUCOTROL) 10 MG tablet Take 10 mg by mouth daily before breakfast.     . levothyroxine (SYNTHROID, LEVOTHROID) 100 MCG tablet Take 1 tablet by mouth daily.    . metFORMIN (GLUCOPHAGE) 500 MG tablet Take 500 mg by mouth 2 (two) times daily with a meal.    . nitroGLYCERIN (NITROSTAT) 0.4 MG SL tablet Place 0.4 mg under the tongue daily as needed for chest pain.     Marland Kitchen. omeprazole (PRILOSEC) 20 MG capsule Take 20 mg by mouth daily.     . pravastatin (PRAVACHOL) 80 MG tablet Take 40 mg by mouth  daily.     . valsartan (DIOVAN) 80 MG tablet Take 40 mg by mouth daily.     No facility-administered medications prior to visit.      Allergies:   Review of patient's allergies indicates no known allergies.   Social History   Social History  . Marital status: Married    Spouse name: N/A  . Number of children: N/A  . Years of education: N/A   Occupational History  . retired    Social History Main Topics  . Smoking status: Former Smoker    Quit date: 07/31/1993  . Smokeless tobacco: None  . Alcohol use No  . Drug use: Unknown  . Sexual activity: Not Asked   Other Topics Concern  . None   Social History Narrative  . None     Family History:  The patient's family history includes Cancer in his brother; Heart attack in his brother, brother, and mother.   ROS:     Please see the history of present illness.    ROS  Leg swelling, shortness of breath, shortness of breath with activity, depression, anxiety, wheezing, fatigue All other systems reviewed and are negative.   PHYSICAL EXAM:   VS:  BP 118/76   Pulse 68   Ht 5\' 8"  (1.727 m)   Wt 286 lb 12.8 oz (130.1 kg)   BMI 43.61 kg/m    GEN: Well nourished, well developed, in no acute distress  HEENT: normal  Neck: no JVD, carotid bruits, or masses Cardiac: RRR; no murmurs, rubs, or gallops,no edema  Respiratory:  clear to auscultation bilaterally, normal work of breathing GI: soft, nontender, nondistended, + BS , obese MS: no deformity or atrophy  Skin: warm and dry, no rash Neuro:  Alert and Oriented x 3, Strength and sensation are intact Psych: euthymic mood, full affect  Wt Readings from Last 3 Encounters:  11/19/15 286 lb 12.8 oz (130.1 kg)  06/30/15 286 lb (129.7 kg)  06/20/15 281 lb 2 oz (127.5 kg)      Studies/Labs Reviewed:   EKG:  EKG is ordered today.  The ekg ordered today demonstrates  05/09/15 shows normal sinus rhythm, 75 with nonspecific ST-T wave changes, small Q waves in the inferior leads noted.  Recent Labs: 06/20/2015: BUN 25; Creat 1.14; Hemoglobin 15.3; Platelets 223; Potassium 3.6; Sodium 136   Lipid Panel No results found for: CHOL, TRIG, HDL, CHOLHDL, VLDL, LDLCALC, LDLDIRECT  Additional studies/ records that were reviewed today include:  - spirometry 05/10/14 FEV1 (1.42) 44% ratio 84  - Cards eval in [place] 11/14/14 [facility] (care everywhere) > rec lexiscan but not done  - trial off acei 12/23/2014 > marked improvement  - 03/21/2015 Walked RA x 3 laps @ 185 ft each stopped due to End of study, nl pace, no sob or desat But mild chest burning that resolved w/in a few min at rest  - PFT's 03/21/2015 FEV1 2.07 (63 % ) ratio 86 p 10 % improvement from saba with DLCO 69 % corrects to 120 % for alv volume and ERV 30%   Cardiac Cath 05/12/15:  Prox RCA lesion, 100%  stenosed.  Mid LAD lesion, 100% stenosed.  Prox Cx to Mid Cx lesion, 100% stenosed.  LIMA was injected is normal in caliber, and is anatomically normal.  Patent LIMA graft which supplies the mid LAD  Radial artery was injected is normal in caliber, and is anatomically normal.  Patent radial artery graft supplying the very high first marginal (almost ramus  intermediate, like vessel)  SVG .  There is severe disease in the graft.  Occluded vein graft prior to the proximally stented segment in the graft that supplied the RCA  Origin to Prox Graft lesion, 100% stenosed. The lesion was previously treated with a stent (unknown type).  SVG was injected is normal in caliber.  There is severe disease in the graft.  Occluded vein graft with that supplied the third marginal branch  Origin lesion, 100% stenosed.  There is moderate to severe left ventricular systolic dysfunction.  Moderately severe global LV dysfunction with diffuse hypocontractility and an ejection fraction of 30-35%.  Severe native CAD with total occlusion of the proximal LAD after the first septal and diagonal vessel; total occlusion of the proximal circumflex; and total occlusion of the proximal RCA. Faint collaterals arising from the septal perforating artery supplying a portion of the distal RCA.  Patent LIMA graft supplying the mid LAD.  Patent radial artery graft supplying a high marginal branch of the left circumflex coronary artery.  Occluded vein graft that supplied a distal marginal vessel.  Occluded vein graft proximal to a previously stented portion in the very proximal graft which had supplied the distal RCA.  RECOMMENDATION: Increased medical therapy. Consider the addition of her ranolazine in addition to nitrates, beta blocker, and possible amlodipine.   ASSESSMENT:    1. Angina decubitus (HCC)   2. Chronic systolic heart failure (HCC)   3. Morbid obesity due to excess calories (HCC)       PLAN:  In order of problems listed above:  Severe coronary artery disease status post bypass with exertional angina -Cardiac catheterization reviewed, severe results. LIMA to LAD is patent, SVG to circumflex is patent. Other SVG grafts are occluded, previously stented RCA graft is occluded. OM 3 graft is occluded. EF 30-35%.  At prior visit his blood pressure was very low and we decided to discontinue his hydrochlorothiazide, decrease his Lasix to 40 mg a day and stop his isosorbide. Sherryll Burger.  Entresto would be challenging based upon his decreased blood pressure.  He remains quite active. Still goes to the job. He is not one just to sit around. Shortness of breath continues to be his main complaint.  Exertional angina -classic exertional anginal symptoms. Improved currently  Dilated cardiomyopathy -As above. Medications adjusted.   COPD - Dr. Sherene SiresWert has seen in the past. ACE inhibitor was stopped, valsartan was started. No longer needs to see he states.   Morbid obesity -continue to encourage weight loss. Out of all of our interventions I think that this would be the best one for him. He understands that he is carrying around 100 extra pounds in the told that this takes.  Hyperlipidemia -currently on pravastatin    Medication Adjustments/Labs and Tests Ordered: Current medicines are reviewed at length with the patient today.  Concerns regarding medicines are outlined above.  Medication changes, Labs and Tests ordered today are listed in the Patient Instructions below. Patient Instructions  Medication Instructions:  Your physician recommends that you continue on your current medications as directed. Please refer to the Current Medication list given to you today.   Labwork: NONE  Testing/Procedures: NONE  Follow-Up: Your physician wants you to follow-up in: 6 MONTHS WITH DR. Anne FuSKAINS You will receive a reminder letter in the mail two months in advance. If you don't receive a  letter, please call our office to schedule the follow-up appointment.   Any Other Special Instructions Will Be Listed Below (If Applicable).  If you need a refill on your cardiac medications before your next appointment, please call your pharmacy.        Signed, Donato Schultz, MD  11/19/2015 9:58 AM    [facility] [address] [place], [place], [place]  [address] Phone: [phone]; Fax: [phone]

## 2015-11-19 NOTE — Patient Instructions (Signed)
Medication Instructions:  Your physician recommends that you continue on your current medications as directed. Please refer to the Current Medication list given to you today.  Labwork: NONE  Testing/Procedures: NONE  Follow-Up: Your physician wants you to follow-up in: 6 MONTHS WITH DR. SKAINS. You will receive a reminder letter in the mail two months in advance. If you don't receive a letter, please call our office to schedule the follow-up appointment.  Any Other Special Instructions Will Be Listed Below (If Applicable).  If you need a refill on your cardiac medications before your next appointment, please call your pharmacy. 

## 2016-01-01 ENCOUNTER — Other Ambulatory Visit: Payer: Self-pay | Admitting: Internal Medicine

## 2016-02-21 IMAGING — CT CT CHEST W/O CM
2 of 3 series · 13 of 36 positions shown, 16 images · non-contrast
Comparison: Chest CT 05/06/2014.

CLINICAL DATA: 64-year-old male with history of right lower lobe
pulmonary nodule noted on prior CT examination. Chronic shortness of
breath. Followup study.

EXAM:
CT CHEST WITHOUT CONTRAST
TECHNIQUE: Multidetector CT imaging of the chest was performed following the
standard protocol without IV contrast.

[Series 2: thorax · axial · 0.83mm/px · z∈[-392,-147]mm · 10 of 59 slices shown, 13 images]
[im 5/59  mediastinal]
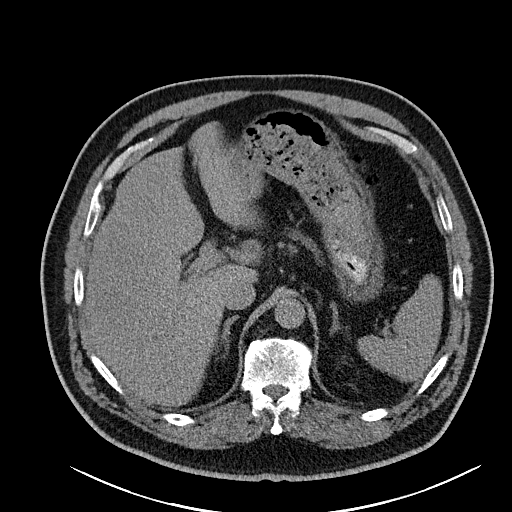
[im 5/59  lung]
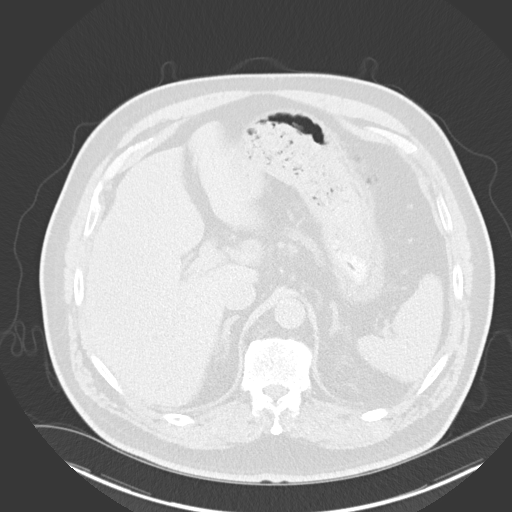
[im 9/59  lung]
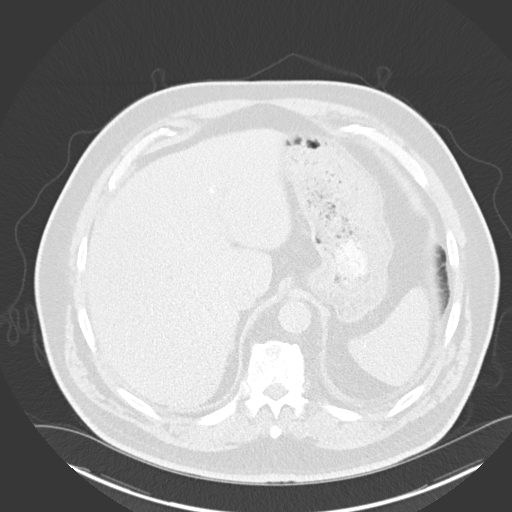
[im 16/59  lung]
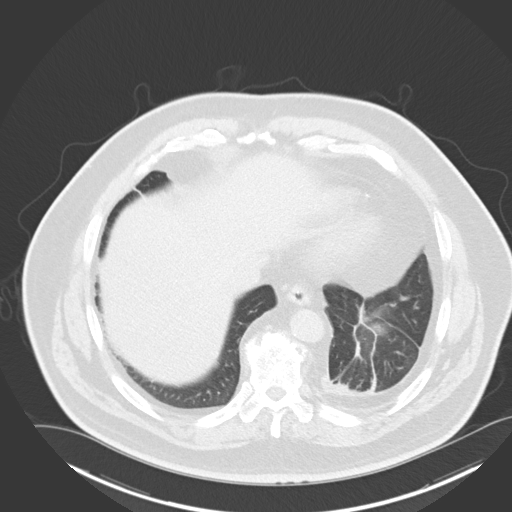
[im 22/59  lung]
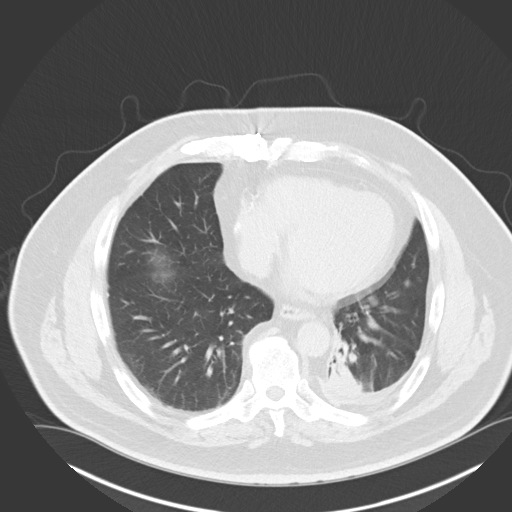
[im 26/59  mediastinal]
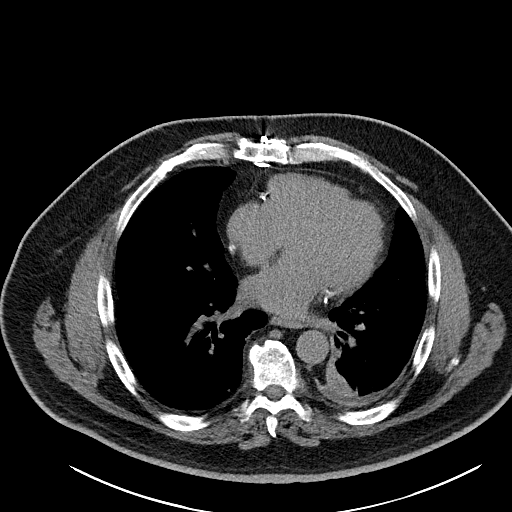
[im 26/59  lung]
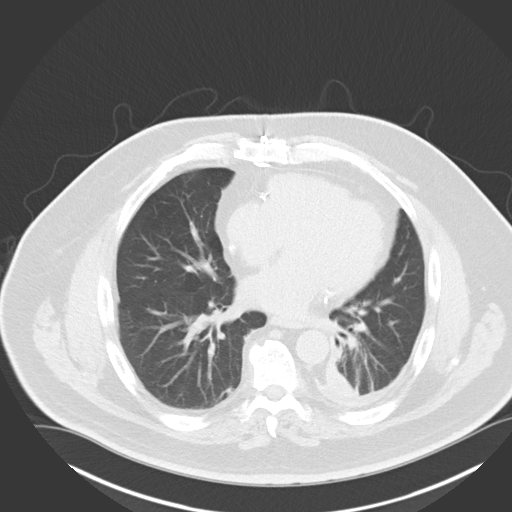
[im 33/59  lung]
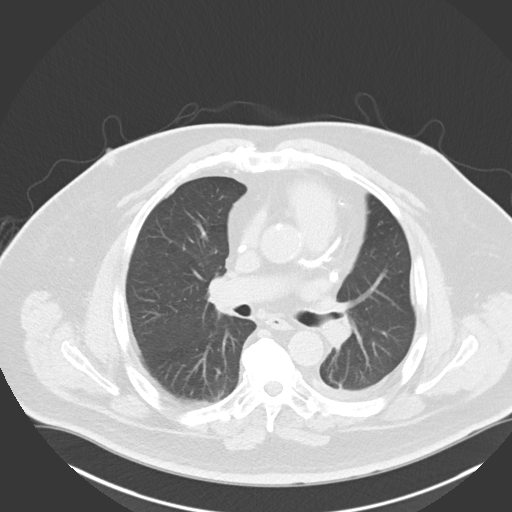
[im 37/59  lung]
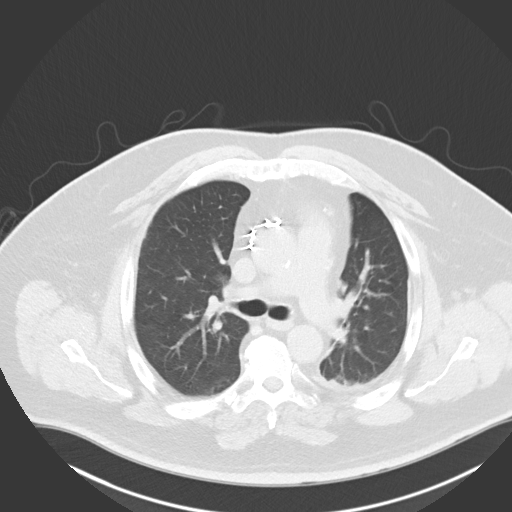
[im 43/59  lung]
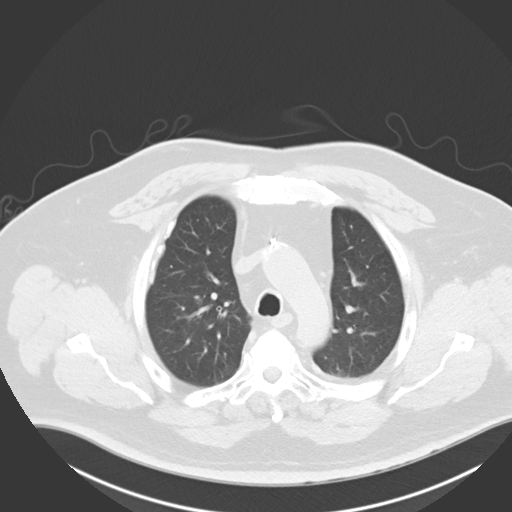
[im 50/59  mediastinal]
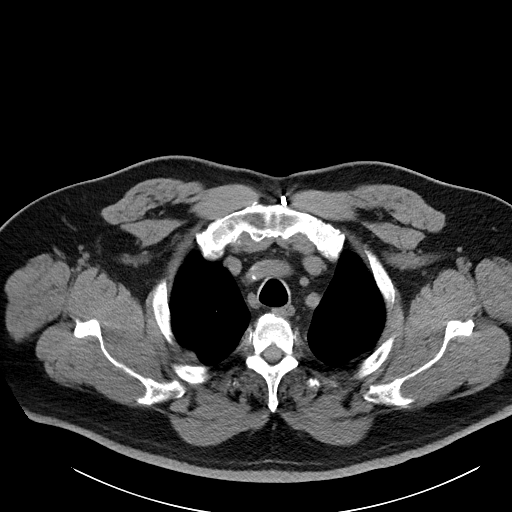
[im 50/59  lung]
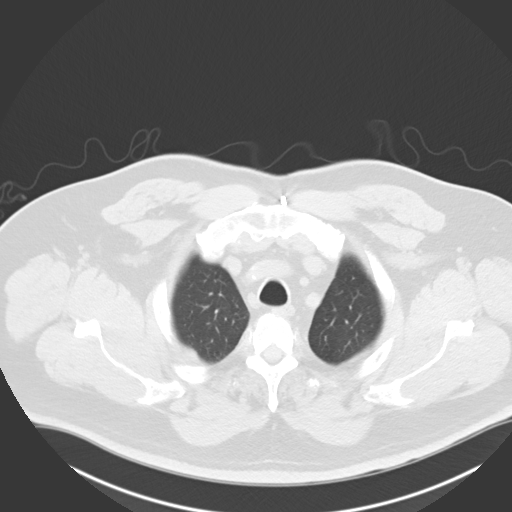
[im 54/59  lung]
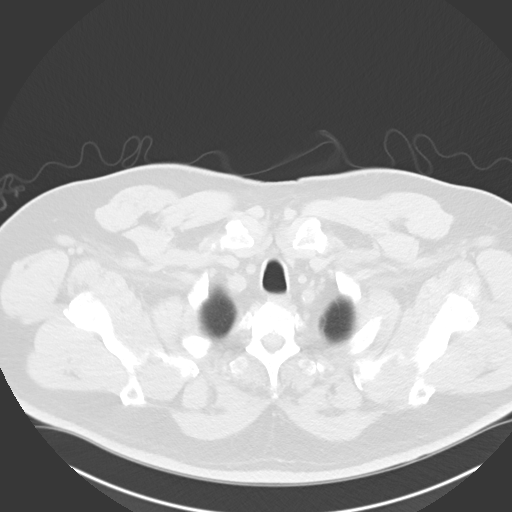

[Series 5: coronal · coronal · 0.59mm/px · 3 of 147 slices shown]
[im 30/147  lung]
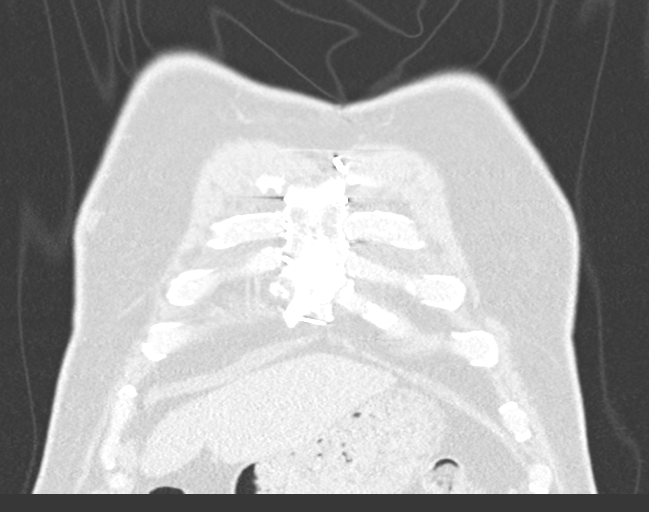
[im 59/147  lung]
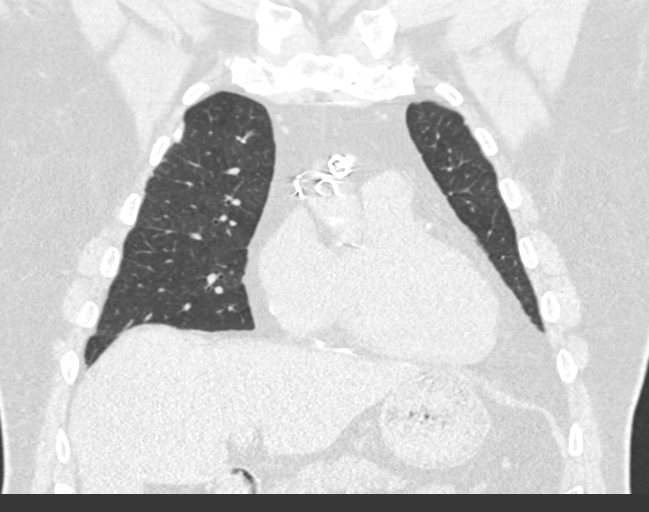
[im 88/147  lung]
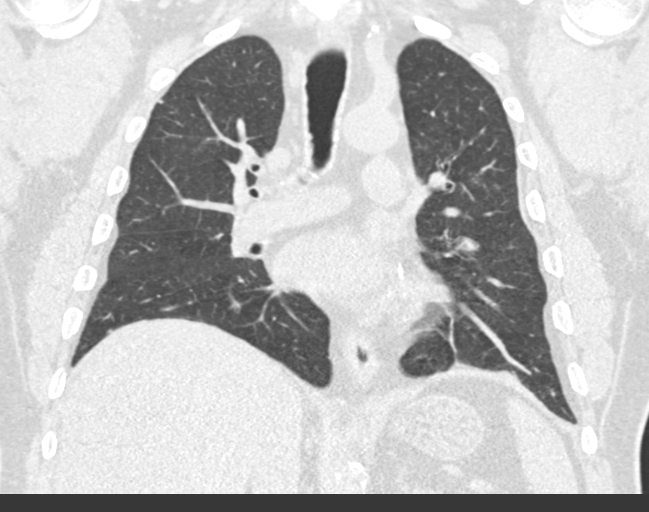

[13 of 36 positions shown; findings below may reference images not displayed]

FINDINGS: Mediastinum/Lymph Nodes: Heart size is normal. There is no
significant pericardial fluid, thickening or pericardial
calcification. There is atherosclerosis of the thoracic aorta, the
great vessels of the mediastinum and the coronary arteries,
including calcified atherosclerotic plaque in the left main, left
anterior descending, left circumflex and right coronary arteries.
Status post median sternotomy for CABG, including [REDACTED] to the LAD.
Severe calcifications of the aortic valve. No pathologically
enlarged mediastinal or hilar lymph nodes. Please note that accurate
exclusion of hilar adenopathy is limited on noncontrast CT scans.
Esophagus is unremarkable in appearance.

Lungs/Pleura: Previously noted 8 mm subpleural nodule in the
posterior aspect of the right lower lobe (image 35 of series 3) is
unchanged on prior studies dating back to 06/08/2010, considered
benign (likely a subpleural lymph node) requiring no future imaging
followup at this time. 6 mm subpleural nodule in the superior
segment of the right lower lobe (image 26 of series 3), unchanged in
retrospect compared to prior study 05/06/2014, also favored to be a
benign subpleural lymph node. The other previously noted tiny
pulmonary nodules are no longer identified. There are a few new 1-3
mm nodules noted in the right upper lobe, which are nonspecific, but
favored to reflect areas of mild mucoid impaction within terminal
bronchioles. No larger more suspicious appearing pulmonary nodules
or masses are identified. Small calcified granulomas in the left
lower lobe. Calcified pleural plaques in the right hemithorax again
noted. Chronic pleural thickening with chronic trace left pleural
effusion lying dependently, unchanged. Faint calcification
identified in a pleural plaque in the left hemithorax as well (image
14 of series 2), favoring asbestos related pleural disease. Chronic
rounded opacity in the medial aspect of the left lower lobe,
unchanged compared to prior examinations dating back to at least
06/08/2010, most compatible with rounded atelectasis. No acute
consolidative airspace disease.

Upper Abdomen: Tiny calcified granulomas in the liver and spleen.
Mild diffuse decreased attenuation throughout the hepatic
parenchyma, compatible with hepatic steatosis. Status post
cholecystectomy.

Musculoskeletal/Soft Tissues: There are no aggressive appearing
lytic or blastic lesions noted in the visualized portions of the
skeleton.
IMPRESSION: 1. Previously noted 8 mm right lower lobe nodule is unchanged dating
back to 06/08/2010, considered benign, presumably a subpleural lymph
node. 6 mm subpleural nodule in the superior segment of the right
lower lobe is also unchanged compared to the more recent prior from
05/06/2014, also presumably a subpleural lymph node.
2. Chronic rounded atelectasis in the left lower lobe is unchanged.
3. Stigmata of asbestos related pleural disease redemonstrated,
including bilateral calcified pleural plaques, chronic trace left
pleural effusion, and associated rounded atelectasis in the left
lower lobe.
4. Sequela of old granulomatous disease, as above.
5. Atherosclerosis, including left main and 3 vessel coronary artery
disease. Status post median sternotomy for CABG, including [REDACTED] to
the LAD.
6. There are calcifications of the aortic valve. Echocardiographic
correlation for evaluation of potential valvular dysfunction may be
warranted if clinically indicated.
7. Hepatic steatosis.

## 2016-07-18 DIAGNOSIS — E1142 Type 2 diabetes mellitus with diabetic polyneuropathy: Secondary | ICD-10-CM | POA: Insufficient documentation

## 2016-07-18 DIAGNOSIS — J9601 Acute respiratory failure with hypoxia: Secondary | ICD-10-CM | POA: Insufficient documentation

## 2016-07-19 DIAGNOSIS — I5031 Acute diastolic (congestive) heart failure: Secondary | ICD-10-CM | POA: Insufficient documentation

## 2016-07-19 DIAGNOSIS — Z8679 Personal history of other diseases of the circulatory system: Secondary | ICD-10-CM | POA: Insufficient documentation

## 2016-07-20 DIAGNOSIS — Z6841 Body Mass Index (BMI) 40.0 and over, adult: Secondary | ICD-10-CM

## 2016-07-20 DIAGNOSIS — R601 Generalized edema: Secondary | ICD-10-CM | POA: Insufficient documentation

## 2016-07-20 DIAGNOSIS — J988 Other specified respiratory disorders: Secondary | ICD-10-CM | POA: Insufficient documentation

## 2016-07-28 ENCOUNTER — Ambulatory Visit (INDEPENDENT_AMBULATORY_CARE_PROVIDER_SITE_OTHER): Payer: Medicare Other | Admitting: Cardiology

## 2016-07-28 ENCOUNTER — Encounter (INDEPENDENT_AMBULATORY_CARE_PROVIDER_SITE_OTHER): Payer: Self-pay

## 2016-07-28 ENCOUNTER — Encounter: Payer: Self-pay | Admitting: Cardiology

## 2016-07-28 VITALS — BP 136/80 | HR 74 | Ht 68.0 in | Wt 281.2 lb

## 2016-07-28 DIAGNOSIS — E7800 Pure hypercholesterolemia, unspecified: Secondary | ICD-10-CM | POA: Diagnosis not present

## 2016-07-28 DIAGNOSIS — I5022 Chronic systolic (congestive) heart failure: Secondary | ICD-10-CM | POA: Diagnosis not present

## 2016-07-28 DIAGNOSIS — I208 Other forms of angina pectoris: Secondary | ICD-10-CM | POA: Diagnosis not present

## 2016-07-28 DIAGNOSIS — R400 Somnolence: Secondary | ICD-10-CM

## 2016-07-28 NOTE — Patient Instructions (Signed)
Medication Instructions:  1) Weigh yourself daily. You make take an extra Lasix 40 mg a day if you gain 2 pounds.   Labwork: None  Testing/Procedures: Your physician has recommended that you have a sleep study. This test records several body functions during sleep, including: brain activity, eye movement, oxygen and carbon dioxide blood levels, heart rate and rhythm, breathing rate and rhythm, the flow of air through your mouth and nose, snoring, body muscle movements, and chest and belly movement.  Follow-Up: Your physician wants you to follow-up in: 4 months with Bary Castilla, PA. You will receive a reminder letter in the mail two months in advance. If you don't receive a letter, please call our office to schedule the follow-up appointment.   Any Other Special Instructions Will Be Listed Below (If Applicable). Fluid Restriction Some health conditions may require you to restrict your fluid intake. This means that you need to limit the amount of fluid you drink each day. When you have a fluid restriction, you must carefully measure and keep track of the amount of fluid you drink. Your health care provider will identify the specific amount of fluid you are allowed each day. This amount may depend on several things, such as:  The amount of urine you produce in a day.  How much fluid you are keeping (retaining) in your body.  Your blood pressure. What is my plan? Your health care provider recommends that you limit your fluid intake to 1 LITER per day. What counts toward my fluid intake? Your fluid intake includes all liquids that you drink, as well as any foods that become liquid at room temperature. The following are examples of some fluids that you will have to restrict:  Tea, coffee, soda, lemonade, milk, water, juice, sport drinks, and nutritional supplement beverages.  Alcoholic beverages.  Cream.  Gravy.  Ice cubes.  Soup and broth. The following are examples of foods that  become liquid at room temperature. These foods will also count toward your fluid intake.  Ice cream and ice milk.  Frozen yogurt and sherbet.  Frozen ice pops.  Flavored gelatin. How do I keep track of my fluid intake? Each morning, fill a jug with the amount of water that equals the amount of fluid you are allowed for the day. You can use this water as a guideline for fluid allowance. Each time you take in any form of fluid, including ice cubes and foods that become liquid at room temperature, pour an equal amount of water out of the container. This helps you to see how much fluid you are taking in. It also helps you to see how much of your fluid intake is left for the rest of the day. The following conversions may also be helpful in measuring your fluid intake:  1 cup equals 8 oz (240 mL).   cup equals 6 oz (180 mL).  ? cup equals 5? oz (160 mL).   cup equals 4 oz (120 mL).  ? cup equals 2? oz (80 mL).   cup equals 2 oz (60 mL).  2 Tbsp equals 1 oz (30 mL). What home care instructions should I follow while restricting fluids?  Make sure that you stay within the recommended limit each day. Always measure and keep track of your fluids, as well as any foods that turn liquid at room temperature.  Use small cups and glasses and learn to sip fluids slowly.  Add a slice of fresh lemon or lemon juice to water  or ice. This helps to satisfy your thirst.  Freeze fruit juice or water in an ice cube tray. Use this as part of your fluid allowance. These cubes are useful for quenching your thirst. Measure the amount of liquid in each ice cube prior to freezing so you can subtract this amount from your day's allowance when you consume each frozen cube.  Try frozen fruits between meals, such as grapes or strawberries.  Swallow your pills along with meals or soft foods, such as applesauce or mashed potatoes. This helps you to save your fluid allowance for something that you enjoy.  Weigh  yourself every day. Keeping track of your daily weight can help you and your health care provider to notice as soon as possible if you are retaining too much fluid in your body.  Weigh yourself every morning after you urinate but before you eat breakfast.  Wear the same amount of clothing each time you weigh yourself.  Write down your daily weight. Give this weight record to your health care provider. If your weight is going up, you may be retaining too much fluid. Every 2 cups (480 mL) of fluid retained in the body becomes an extra 1 lb (0.45 kg) of body weight.  Avoid salty foods. These foods make you thirsty and make fluid control more difficult.  Brush your teeth often or rinse your mouth with mouthwash to help your dry mouth. Lemon wedges, hard sour candies, chewing gum, or breath spray may also help to moisten your mouth.  Keep the temperature in your home at a cooler level. Dry air increases thirst, so keep the air in your home as humid as possible.  Avoid being out in the hot sun, which can cause you to sweat and become thirsty. What are some signs that I may be taking in too much fluid? You may be taking in too much fluid if:  Your weight increases. Contact your health care provider if your weight increases 3 lb or more in a day or if it increases 5 lb or more in a week.  Your face, hands, legs, feet, and belly (abdomen) start to swell.  You have trouble breathing. This information is not intended to replace advice given to you by your health care provider. Make sure you discuss any questions you have with your health care provider. Document Released: 01/10/2007 Document Revised: 08/21/2015 Document Reviewed: 08/14/2013 Elsevier Interactive Patient Education  2017 [employer]  Low-Sodium Eating Plan Sodium, which is an element that makes up salt, helps you maintain a healthy balance of fluids in your body. Too much sodium can increase your blood pressure and cause fluid and  waste to be held in your body. Your health care provider or dietitian may recommend following this plan if you have high blood pressure (hypertension), kidney disease, liver disease, or heart failure. Eating less sodium can help lower your blood pressure, reduce swelling, and protect your heart, liver, and kidneys. What are tips for following this plan? General guidelines   Most people on this plan should limit their sodium intake to 1,500-2,000 mg (milligrams) of sodium each day. Reading food labels   The Nutrition Facts label lists the amount of sodium in one serving of the food. If you eat more than one serving, you must multiply the listed amount of sodium by the number of servings.  Choose foods with less than 140 mg of sodium per serving.  Avoid foods with 300 mg of sodium or more per serving.  Shopping   Look for lower-sodium products, often labeled as "low-sodium" or "no salt added."  Always check the sodium content even if foods are labeled as "unsalted" or "no salt added".  Buy fresh foods.  Avoid canned foods and premade or frozen meals.  Avoid canned, cured, or processed meats  Buy breads that have less than 80 mg of sodium per slice. Cooking   Eat more home-cooked food and less restaurant, buffet, and fast food.  Avoid adding salt when cooking. Use salt-free seasonings or herbs instead of table salt or sea salt. Check with your health care provider or pharmacist before using salt substitutes.  Cook with plant-based oils, such as canola, sunflower, or olive oil. Meal planning   When eating at a restaurant, ask that your food be prepared with less salt or no salt, if possible.  Avoid foods that contain MSG (monosodium glutamate). MSG is sometimes added to [place] food, bouillon, and some canned foods. What foods are recommended? The items listed may not be a complete list. Talk with your dietitian about what dietary choices are best for you. Grains  Low-sodium  cereals, including oats, puffed wheat and rice, and shredded wheat. Low-sodium crackers. Unsalted rice. Unsalted pasta. Low-sodium bread. Whole-grain breads and whole-grain pasta. Vegetables  Fresh or frozen vegetables. "No salt added" canned vegetables. "No salt added" tomato sauce and paste. Low-sodium or reduced-sodium tomato and vegetable juice. Fruits  Fresh, frozen, or canned fruit. Fruit juice. Meats and other protein foods  Fresh or frozen (no salt added) meat, poultry, seafood, and fish. Low-sodium canned tuna and salmon. Unsalted nuts. Dried peas, beans, and lentils without added salt. Unsalted canned beans. Eggs. Unsalted nut butters. Dairy  Milk. Soy milk. Cheese that is naturally low in sodium, such as ricotta cheese, fresh mozzarella, or Swiss cheese Low-sodium or reduced-sodium cheese. Cream cheese. Yogurt. Fats and oils  Unsalted butter. Unsalted margarine with no trans fat. Vegetable oils such as canola or olive oils. Seasonings and other foods  Fresh and dried herbs and spices. Salt-free seasonings. Low-sodium mustard and ketchup. Sodium-free salad dressing. Sodium-free light mayonnaise. Fresh or refrigerated horseradish. Lemon juice. Vinegar. Homemade, reduced-sodium, or low-sodium soups. Unsalted popcorn and pretzels. Low-salt or salt-free chips. What foods are not recommended? The items listed may not be a complete list. Talk with your dietitian about what dietary choices are best for you. Grains  Instant hot cereals. Bread stuffing, pancake, and biscuit mixes. Croutons. Seasoned rice or pasta mixes. Noodle soup cups. Boxed or frozen macaroni and cheese. Regular salted crackers. Self-rising flour. Vegetables  Sauerkraut, pickled vegetables, and relishes. Olives. [place] fries. Onion rings. Regular canned vegetables (not low-sodium or reduced-sodium). Regular canned tomato sauce and paste (not low-sodium or reduced-sodium). Regular tomato and vegetable juice (not low-sodium or  reduced-sodium). Frozen vegetables in sauces. Meats and other protein foods  Meat or fish that is salted, canned, smoked, spiced, or pickled. Bacon, ham, sausage, hotdogs, corned beef, chipped beef, packaged lunch meats, salt pork, jerky, pickled herring, anchovies, regular canned tuna, sardines, salted nuts. Dairy  Processed cheese and cheese spreads. Cheese curds. Blue cheese. Feta cheese. String cheese. Regular cottage cheese. Buttermilk. Canned milk. Fats and oils  Salted butter. Regular margarine. Ghee. Bacon fat. Seasonings and other foods  Onion salt, garlic salt, seasoned salt, table salt, and sea salt. Canned and packaged gravies. Worcestershire sauce. Tartar sauce. Barbecue sauce. Teriyaki sauce. Soy sauce, including reduced-sodium. Steak sauce. Fish sauce. Oyster sauce. Cocktail sauce. Horseradish that you find on the shelf. Regular  ketchup and mustard. Meat flavorings and tenderizers. Bouillon cubes. Hot sauce and Tabasco sauce. Premade or packaged marinades. Premade or packaged taco seasonings. Relishes. Regular salad dressings. Salsa. Potato and tortilla chips. Corn chips and puffs. Salted popcorn and pretzels. Canned or dried soups. Pizza. Frozen entrees and pot pies. Summary  Eating less sodium can help lower your blood pressure, reduce swelling, and protect your heart, liver, and kidneys.  Most people on this plan should limit their sodium intake to 1,500-2,000 mg (milligrams) of sodium each day.  Canned, boxed, and frozen foods are high in sodium. Restaurant foods, fast foods, and pizza are also very high in sodium. You also get sodium by adding salt to food.  Try to cook at home, eat more fresh fruits and vegetables, and eat less fast food, canned, processed, or prepared foods. This information is not intended to replace advice given to you by your health care provider. Make sure you discuss any questions you have with your health care provider. Document Released: 09/04/2001  Document Revised: 03/08/2016 Document Reviewed: 03/08/2016 Elsevier Interactive Patient Education  2017 [employer].     If you need a refill on your cardiac medications before your next appointment, please call your pharmacy.

## 2016-07-28 NOTE — Progress Notes (Signed)
Cardiology Office Note    Date:  07/29/2016   ID:  Nathan White, DOB January 31, 1951, MRN [id number]  PCP:  Quitman Livings, MD  Cardiologist:   Donato Schultz, MD     History of Present Illness:  Nathan White is a 66 y.o. male  Here for the evaluation of chest pain at the request of Dr. Sherene Sires. Previously had been referred to the pulmonary clinic in September 2016 because of shortness of breath/chest pain. Has a warm burning sensation in his chest, no wheezing, no cough. Per Dr. Thurston Hole note had had a negative cardiac workup. He has chest tightness that was reproducible with exertion more than 50 feet of walking almost every time but not always. A few times he has had a smother like sensation over the past couple months and sleeps in a recliner after taking  Xanax.  If lifts something burning. Had CAD, stents in [place]. Had CABG. In 64 at age 67 had MI. 12 years later had CABG. Dr. Arvilla Market. Left  Radial harvested.  In Jan 2016 - 2 blockages, one was opened and one could not be opened.   Grandson died from drunk driver. On motorcycle.    Dr. Richardine Service.   06/30/15-overall he is doing better after we discontinued his HCTZ, Imdur, decreased his Lasix. Blood pressure prior visit was quite low and he felt "washed out". No chest pain, no syncope, no bleeding. Reviewed cardiac catheterization once again.  11/19/15-other than shortness of breath and decreased energy, no other complaints. We discussed weight loss again. No chest pain. Just shortness of breath.  07/28/16-overall seems to be doing quite well today. He was admitted to [facility] with acute diastolic heart failure. [place] hurting him. There were tight with fluid. He had eaten many hotdogs previously. His ejection fraction was normal. This is an improvement. No PE on CT. No DVT on Doppler. [place] - took off 8 pounds fluid. IV lasix.  Instruction is to take an extra Lasix if  weight increased. He was discharged with oxygen.  I reviewed Dr. Sherene Sires note from 03/21/15 and his FEV1 was 63%.   Past Medical History:  Diagnosis Date  . Diabetes (HCC)   . Heart attack (HCC)   . High blood pressure   . High cholesterol     Past Surgical History:  Procedure Laterality Date  . BACK SURGERY    . Cardiac bypass    . CARDIAC CATHETERIZATION N/A 05/12/2015   Procedure: Left Heart Cath and Cors/Grafts Angiography;  Surgeon: Lennette Bihari, MD;  Location: [facility] INVASIVE CV LAB;  Service: Cardiovascular;  Laterality: N/A;  . GALLBLADDER SURGERY    . STENT PLACEMENT VASCULAR ([facility] HX)      Outpatient Medications Prior to Visit  Medication Sig Dispense Refill  . ALPRAZolam (XANAX) 1 MG tablet Take 1 mg by mouth at bedtime as needed for anxiety.    Marland Kitchen aspirin 81 MG tablet Take 81 mg by mouth daily.    . carvedilol (COREG) 6.25 MG tablet Take 1 tablet (6.25 mg total) by mouth 2 (two) times daily. 180 tablet 3  . clopidogrel (PLAVIX) 75 MG tablet Take 75 mg by mouth daily.    . fluticasone (FLONASE) 50 MCG/ACT nasal spray Place 1 spray into both nostrils daily as needed for allergies.     Marland Kitchen glipiZIDE (GLUCOTROL) 10 MG tablet Take 10 mg by mouth daily before breakfast.     .  levothyroxine (SYNTHROID, LEVOTHROID) 100 MCG tablet Take 1 tablet by mouth daily.    . metFORMIN (GLUCOPHAGE) 500 MG tablet Take 500 mg by mouth 2 (two) times daily with a meal.    . nitroGLYCERIN (NITROSTAT) 0.4 MG SL tablet Place 0.4 mg under the tongue daily as needed for chest pain.     Marland Kitchen omeprazole (PRILOSEC) 20 MG capsule Take 20 mg by mouth daily.     . pravastatin (PRAVACHOL) 80 MG tablet Take 40 mg by mouth daily.     . valsartan (DIOVAN) 80 MG tablet Take 40 mg by mouth daily.    . furosemide (LASIX) 40 MG tablet Take 40 mg by mouth daily.     No facility-administered medications prior to visit.      Allergies:   Patient has no known allergies.   Social History   Social History  . Marital  status: Married    Spouse name: N/A  . Number of children: N/A  . Years of education: N/A   Occupational History  . retired    Social History Main Topics  . Smoking status: Former Smoker    Quit date: 07/31/1993  . Smokeless tobacco: Never Used  . Alcohol use No  . Drug use: Unknown  . Sexual activity: Not Asked   Other Topics Concern  . None   Social History Narrative  . None     Family History:  The patient's family history includes Cancer in his brother; Heart attack in his brother, brother, and mother.   ROS:   Please see the history of present illness.    ROS  unless stated above all other review of systems negative.   PHYSICAL EXAM:   VS:  BP 136/80   Pulse 74   Ht  (1.727 m)   Wt 281 lb 3.2 oz (127.6 kg)   SpO2 (!) 88%   BMI 42.76 kg/m    GEN: Well nourished, well developed, in no acute distress  HEENT: normal  Neck: no JVD, carotid bruits, or masses Cardiac: RRR; no murmurs, rubs, or gallops,no edema  Respiratory:  clear to auscultation bilaterally, normal work of breathing GI: soft, nontender, nondistended, + BS , obese MS: no deformity or atrophy  Skin: warm and dry, no rash Neuro:  Alert and Oriented x 3, Strength and sensation are intact Psych: euthymic mood, full affect  Wt Readings from Last 3 Encounters:  07/28/16 281 lb 3.2 oz (127.6 kg)  11/19/15 286 lb 12.8 oz (130.1 kg)  06/30/15 286 lb (129.7 kg)      Studies/Labs Reviewed:   EKG:  EKG is ordered today.  The ekg ordered today demonstrates  05/09/15 shows normal sinus rhythm, 75 with nonspecific ST-T wave changes, small Q waves in the inferior leads noted.  Recent Labs: No results found for requested labs within last 8760 hours.   Lipid Panel No results found for: CHOL, TRIG, HDL, CHOLHDL, VLDL, LDLCALC, LDLDIRECT  Additional studies/ records that were reviewed today include:  - spirometry 05/10/14 FEV1 (1.42) 44% ratio 84  - Cards eval in [facility] 11/14/14 [facility] (care everywhere) > rec  lexiscan but not done  - trial off acei 12/23/2014 > marked improvement  - 03/21/2015 Walked RA x 3 laps @ 185 ft each stopped due to End of study, nl pace, no sob or desat But mild chest burning that resolved w/in a few min at rest  - PFT's 03/21/2015 FEV1 2.07 (63 % ) ratio 86 p 10 % improvement from  saba with DLCO 69 % corrects to 120 % for alv volume and ERV 30%   Cardiac Cath 05/12/15:  Prox RCA lesion, 100% stenosed.  Mid LAD lesion, 100% stenosed.  Prox Cx to Mid Cx lesion, 100% stenosed.  LIMA was injected is normal in caliber, and is anatomically normal.  Patent LIMA graft which supplies the mid LAD  Radial artery was injected is normal in caliber, and is anatomically normal.  Patent radial artery graft supplying the very high first marginal (almost ramus intermediate, like vessel)  SVG .  There is severe disease in the graft.  Occluded vein graft prior to the proximally stented segment in the graft that supplied the RCA  Origin to Prox Graft lesion, 100% stenosed. The lesion was previously treated with a stent (unknown type).  SVG was injected is normal in caliber.  There is severe disease in the graft.  Occluded vein graft with that supplied the third marginal branch  Origin lesion, 100% stenosed.  There is moderate to severe left ventricular systolic dysfunction.  Moderately severe global LV dysfunction with diffuse hypocontractility and an ejection fraction of 30-35%.  Severe native CAD with total occlusion of the proximal LAD after the first septal and diagonal vessel; total occlusion of the proximal circumflex; and total occlusion of the proximal RCA. Faint collaterals arising from the septal perforating artery supplying a portion of the distal RCA.  Patent LIMA graft supplying the mid LAD.  Patent radial artery graft supplying a high marginal branch of the left circumflex coronary artery.  Occluded vein graft that supplied a distal marginal  vessel.  Occluded vein graft proximal to a previously stented portion in the very proximal graft which had supplied the distal RCA.  RECOMMENDATION: Increased medical therapy. Consider the addition of her ranolazine in addition to nitrates, beta blocker, and possible amlodipine.  [facility] Echocardiogram April 2018-EF 55%. No PE on CT. No DVT in lower extremities.   ASSESSMENT:    1. Chronic systolic heart failure (HCC)   2. Angina decubitus (HCC)   3. Pure hypercholesterolemia   4. Morbid obesity due to excess calories (HCC)   5. Daytime somnolence      PLAN:  In order of problems listed above:  Severe coronary artery disease status post bypass with exertional angina -Cardiac catheterization reviewed, see results. LIMA to LAD is patent, SVG to circumflex is patent. Other SVG grafts are occluded, previously stented RCA graft is occluded. OM 3 graft is occluded. EF 30-35%Previously, now normal.  At prior visit his blood pressure was very low and we decided to discontinue his hydrochlorothiazide, decrease his Lasix to 40 mg a day and stop his isosorbide.   He remains quite active. Still goes to the job. He is not one just to sit around. Shortness of breath continues to be his main complaint.  Exertional angina -Previous classic anginal symptoms. Exertional in quality. Improved. Medical management based upon cardiac catheterization.  Dilated cardiomyopathy-resolved -As above. No significant changes made.  -EF now 55%  COPD - Dr. Sherene Sires has seen in the past. ACE inhibitor was stopped, valsartan was started. No longer needs to see he states. FEV1 63% previously. Per primary team. Likely contributing to his oxygen needs as well as her I have asked him to discuss his oxygen prescription with his primary provider.  Morbid obesity -continue to encourage weight loss. Out of all of our interventions I think that this would be the best one for him. He understands that he is  carrying around 100 extra pounds.   Hyperlipidemia -currently on pravastatin  Given his daytime somnolence and lack of energy, morbid obesity, we will order sleep study.   Medication Adjustments/Labs and Tests Ordered: Current medicines are reviewed at length with the patient today.  Concerns regarding medicines are outlined above.  Medication changes, Labs and Tests ordered today are listed in the Patient Instructions below. Patient Instructions  Medication Instructions:  1) Weigh yourself daily. You make take an extra Lasix 40 mg a day if you gain 2 pounds.   Labwork: None  Testing/Procedures: Your physician has recommended that you have a sleep study. This test records several body functions during sleep, including: brain activity, eye movement, oxygen and carbon dioxide blood levels, heart rate and rhythm, breathing rate and rhythm, the flow of air through your mouth and nose, snoring, body muscle movements, and chest and belly movement.  Follow-Up: Your physician wants you to follow-up in: 4 months with Bary Castilla, PA. You will receive a reminder letter in the mail two months in advance. If you don't receive a letter, please call our office to schedule the follow-up appointment.   Any Other Special Instructions Will Be Listed Below (If Applicable). Fluid Restriction Some health conditions may require you to restrict your fluid intake. This means that you need to limit the amount of fluid you drink each day. When you have a fluid restriction, you must carefully measure and keep track of the amount of fluid you drink. Your health care provider will identify the specific amount of fluid you are allowed each day. This amount may depend on several things, such as:  The amount of urine you produce in a day.  How much fluid you are keeping (retaining) in your body.  Your blood pressure. What is my plan? Your health care provider recommends that you limit your fluid intake to 1 LITER  per day. What counts toward my fluid intake? Your fluid intake includes all liquids that you drink, as well as any foods that become liquid at room temperature. The following are examples of some fluids that you will have to restrict:  Tea, coffee, soda, lemonade, milk, water, juice, sport drinks, and nutritional supplement beverages.  Alcoholic beverages.  Cream.  Gravy.  Ice cubes.  Soup and broth. The following are examples of foods that become liquid at room temperature. These foods will also count toward your fluid intake.  Ice cream and ice milk.  Frozen yogurt and sherbet.  Frozen ice pops.  Flavored gelatin. How do I keep track of my fluid intake? Each morning, fill a jug with the amount of water that equals the amount of fluid you are allowed for the day. You can use this water as a guideline for fluid allowance. Each time you take in any form of fluid, including ice cubes and foods that become liquid at room temperature, pour an equal amount of water out of the container. This helps you to see how much fluid you are taking in. It also helps you to see how much of your fluid intake is left for the rest of the day. The following conversions may also be helpful in measuring your fluid intake:  1 cup equals 8 oz (240 mL).   cup equals 6 oz (180 mL).  ? cup equals 5? oz (160 mL).   cup equals 4 oz (120 mL).  ? cup equals 2? oz (80 mL).   cup equals 2 oz (60 mL).  2 Tbsp equals  1 oz (30 mL). What home care instructions should I follow while restricting fluids?  Make sure that you stay within the recommended limit each day. Always measure and keep track of your fluids, as well as any foods that turn liquid at room temperature.  Use small cups and glasses and learn to sip fluids slowly.  Add a slice of fresh lemon or lemon juice to water or ice. This helps to satisfy your thirst.  Freeze fruit juice or water in an ice cube tray. Use this as part of your fluid  allowance. These cubes are useful for quenching your thirst. Measure the amount of liquid in each ice cube prior to freezing so you can subtract this amount from your day's allowance when you consume each frozen cube.  Try frozen fruits between meals, such as grapes or strawberries.  Swallow your pills along with meals or soft foods, such as applesauce or mashed potatoes. This helps you to save your fluid allowance for something that you enjoy.  Weigh yourself every day. Keeping track of your daily weight can help you and your health care provider to notice as soon as possible if you are retaining too much fluid in your body.  Weigh yourself every morning after you urinate but before you eat breakfast.  Wear the same amount of clothing each time you weigh yourself.  Write down your daily weight. Give this weight record to your health care provider. If your weight is going up, you may be retaining too much fluid. Every 2 cups (480 mL) of fluid retained in the body becomes an extra 1 lb (0.45 kg) of body weight.  Avoid salty foods. These foods make you thirsty and make fluid control more difficult.  Brush your teeth often or rinse your mouth with mouthwash to help your dry mouth. Lemon wedges, hard sour candies, chewing gum, or breath spray may also help to moisten your mouth.  Keep the temperature in your home at a cooler level. Dry air increases thirst, so keep the air in your home as humid as possible.  Avoid being out in the hot sun, which can cause you to sweat and become thirsty. What are some signs that I may be taking in too much fluid? You may be taking in too much fluid if:  Your weight increases. Contact your health care provider if your weight increases 3 lb or more in a day or if it increases 5 lb or more in a week.  Your face, hands, legs, feet, and belly (abdomen) start to swell.  You have trouble breathing. This information is not intended to replace advice given to you by  your health care provider. Make sure you discuss any questions you have with your health care provider. Document Released: 01/10/2007 Document Revised: 08/21/2015 Document Reviewed: 08/14/2013 Elsevier Interactive Patient Education  2017 Elsevier Inc.  Low-Sodium Eating Plan Sodium, which is an element that makes up salt, helps you maintain a healthy balance of fluids in your body. Too much sodium can increase your blood pressure and cause fluid and waste to be held in your body. Your health care provider or dietitian may recommend following this plan if you have high blood pressure (hypertension), kidney disease, liver disease, or heart failure. Eating less sodium can help lower your blood pressure, reduce swelling, and protect your heart, liver, and kidneys. What are tips for following this plan? General guidelines   Most people on this plan should limit their sodium intake to 1,500-2,000 mg (  milligrams) of sodium each day. Reading food labels   The Nutrition Facts label lists the amount of sodium in one serving of the food. If you eat more than one serving, you must multiply the listed amount of sodium by the number of servings.  Choose foods with less than 140 mg of sodium per serving.  Avoid foods with 300 mg of sodium or more per serving. Shopping   Look for lower-sodium products, often labeled as "low-sodium" or "no salt added."  Always check the sodium content even if foods are labeled as "unsalted" or "no salt added".  Buy fresh foods.  Avoid canned foods and premade or frozen meals.  Avoid canned, cured, or processed meats  Buy breads that have less than 80 mg of sodium per slice. Cooking   Eat more home-cooked food and less restaurant, buffet, and fast food.  Avoid adding salt when cooking. Use salt-free seasonings or herbs instead of table salt or sea salt. Check with your health care provider or pharmacist before using salt substitutes.  Cook with plant-based oils,  such as canola, sunflower, or olive oil. Meal planning   When eating at a restaurant, ask that your food be prepared with less salt or no salt, if possible.  Avoid foods that contain MSG (monosodium glutamate). MSG is sometimes added to [place] food, bouillon, and some canned foods. What foods are recommended? The items listed may not be a complete list. Talk with your dietitian about what dietary choices are best for you. Grains  Low-sodium cereals, including oats, puffed wheat and rice, and shredded wheat. Low-sodium crackers. Unsalted rice. Unsalted pasta. Low-sodium bread. Whole-grain breads and whole-grain pasta. Vegetables  Fresh or frozen vegetables. "No salt added" canned vegetables. "No salt added" tomato sauce and paste. Low-sodium or reduced-sodium tomato and vegetable juice. Fruits  Fresh, frozen, or canned fruit. Fruit juice. Meats and other protein foods  Fresh or frozen (no salt added) meat, poultry, seafood, and fish. Low-sodium canned tuna and salmon. Unsalted nuts. Dried peas, beans, and lentils without added salt. Unsalted canned beans. Eggs. Unsalted nut butters. Dairy  Milk. Soy milk. Cheese that is naturally low in sodium, such as ricotta cheese, fresh mozzarella, or Swiss cheese Low-sodium or reduced-sodium cheese. Cream cheese. Yogurt. Fats and oils  Unsalted butter. Unsalted margarine with no trans fat. Vegetable oils such as canola or olive oils. Seasonings and other foods  Fresh and dried herbs and spices. Salt-free seasonings. Low-sodium mustard and ketchup. Sodium-free salad dressing. Sodium-free light mayonnaise. Fresh or refrigerated horseradish. Lemon juice. Vinegar. Homemade, reduced-sodium, or low-sodium soups. Unsalted popcorn and pretzels. Low-salt or salt-free chips. What foods are not recommended? The items listed may not be a complete list. Talk with your dietitian about what dietary choices are best for you. Grains  Instant hot cereals. Bread stuffing,  pancake, and biscuit mixes. Croutons. Seasoned rice or pasta mixes. Noodle soup cups. Boxed or frozen macaroni and cheese. Regular salted crackers. Self-rising flour. Vegetables  Sauerkraut, pickled vegetables, and relishes. Olives. [place] fries. Onion rings. Regular canned vegetables (not low-sodium or reduced-sodium). Regular canned tomato sauce and paste (not low-sodium or reduced-sodium). Regular tomato and vegetable juice (not low-sodium or reduced-sodium). Frozen vegetables in sauces. Meats and other protein foods  Meat or fish that is salted, canned, smoked, spiced, or pickled. Bacon, ham, sausage, hotdogs, corned beef, chipped beef, packaged lunch meats, salt pork, jerky, pickled herring, anchovies, regular canned tuna, sardines, salted nuts. Dairy  Processed cheese and cheese spreads. Cheese curds. Blue cheese.  Feta cheese. String cheese. Regular cottage cheese. Buttermilk. Canned milk. Fats and oils  Salted butter. Regular margarine. Ghee. Bacon fat. Seasonings and other foods  Onion salt, garlic salt, seasoned salt, table salt, and sea salt. Canned and packaged gravies. Worcestershire sauce. Tartar sauce. Barbecue sauce. Teriyaki sauce. Soy sauce, including reduced-sodium. Steak sauce. Fish sauce. Oyster sauce. Cocktail sauce. Horseradish that you find on the shelf. Regular ketchup and mustard. Meat flavorings and tenderizers. Bouillon cubes. Hot sauce and Tabasco sauce. Premade or packaged marinades. Premade or packaged taco seasonings. Relishes. Regular salad dressings. Salsa. Potato and tortilla chips. Corn chips and puffs. Salted popcorn and pretzels. Canned or dried soups. Pizza. Frozen entrees and pot pies. Summary  Eating less sodium can help lower your blood pressure, reduce swelling, and protect your heart, liver, and kidneys.  Most people on this plan should limit their sodium intake to 1,500-2,000 mg (milligrams) of sodium each day.  Canned, boxed, and frozen foods are high in  sodium. Restaurant foods, fast foods, and pizza are also very high in sodium. You also get sodium by adding salt to food.  Try to cook at home, eat more fresh fruits and vegetables, and eat less fast food, canned, processed, or prepared foods. This information is not intended to replace advice given to you by your health care provider. Make sure you discuss any questions you have with your health care provider. Document Released: 09/04/2001 Document Revised: 03/08/2016 Document Reviewed: 03/08/2016 Elsevier Interactive Patient Education  2017 [employer].     If you need a refill on your cardiac medications before your next appointment, please call your pharmacy.        Signed, Donato Schultz, MD  07/29/2016 1:20 PM    [facility] [address] [place], [place], [place]  [address] Phone: [phone]; Fax: [phone]

## 2016-07-30 ENCOUNTER — Telehealth: Payer: Self-pay | Admitting: *Deleted

## 2016-07-30 DIAGNOSIS — R400 Somnolence: Secondary | ICD-10-CM

## 2016-07-30 NOTE — Telephone Encounter (Addendum)
Patient notified of sleep study scheduled for Sunday October 03 2016. Patient understands he will receive a letter in the mail in a week or so. Patient understand to call if his letter does not reach him in a timely manner. Patient agrees with treatment plan and thanked me. ESS=7

## 2016-07-30 NOTE — Telephone Encounter (Signed)
-----   Message from Henrietta DineKathryn A Kemp, RN sent at 07/28/2016  3:08 PM EDT ----- Regarding: sleep study Sleep study ordered for scheduling per Dr. Anne FuSkains PSG ordered 07/28/16 Epworth Scale score: 7  Thanks!

## 2016-10-03 ENCOUNTER — Ambulatory Visit (HOSPITAL_BASED_OUTPATIENT_CLINIC_OR_DEPARTMENT_OTHER): Payer: Medicare Other | Attending: Cardiology | Admitting: Cardiology

## 2016-10-03 DIAGNOSIS — R400 Somnolence: Secondary | ICD-10-CM | POA: Diagnosis present

## 2016-10-03 DIAGNOSIS — G4733 Obstructive sleep apnea (adult) (pediatric): Secondary | ICD-10-CM | POA: Insufficient documentation

## 2016-10-04 NOTE — Procedures (Signed)
Patient Name: Nathan White, Nathan White Date: 10/03/2016 Gender: Male D.O.B: [birth year] Age (years): 66 Referring Provider: Jerline Pain Height (inches): 68 Interpreting Physician: Fransico Him MD, ABSM Weight (lbs): 280 RPSGT: Zadie Rhine BMI: 43 MRN: [id number] Neck Size: 21.00  CLINICAL INFORMATION Sleep Study Type: Split Night CPAP  Indication for sleep study: Excessive Daytime Sleepiness, Obesity  Epworth Sleepiness Score: 2  SLEEP STUDY TECHNIQUE As per the AASM Manual for the Scoring of Sleep and Associated Events v2.3 (April 2016) with a hypopnea requiring 4% desaturations.  The channels recorded and monitored were frontal, central and occipital EEG, electrooculogram (EOG), submentalis EMG (chin), nasal and oral airflow, thoracic and abdominal wall motion, anterior tibialis EMG, snore microphone, electrocardiogram, and pulse oximetry. Continuous positive airway pressure (CPAP) was initiated when the patient met split night criteria and was titrated according to treat sleep-disordered breathing.  MEDICATIONS Medications self-administered by patient taken the night of the study : N/A  RESPIRATORY PARAMETERS Diagnostic Total AHI (/hr): 40.5  RDI (/hr):103.2  OA Index (/hr): 4.4 C A Index (/hr):0.0 REM AHI (/hr): 240.0  NREM AHI (/hr):39.6  Supine AHI (/hr):61.6  Non-supine AHI (/hr):31.20 Min O2 Sat (%):81.00  Mean O2 (%): 87.60  Time below 88% (min):74.1      Titration Optimal Pressure (cm):N/A  AHI at Optimal Pressure (/hr):N/A  Min O2 at Optimal Pressure (%):N/A Supine % at Optimal (%):N/A  Sleep % at Optimal (%):N/A      SLEEP ARCHITECTURE The recording time for the entire night was 373.5 minutes.  During a baseline period of 202.1 minutes, the patient slept for 108.1 minutes in REM and nonREM, yielding a sleep efficiency of 53.5%. Sleep onset after lights out was 42.9 minutes with a REM latency of 158.5 minutes. The patient spent 19.42% of the night  in stage N1 sleep, 80.12% in stage N2 sleep, 0.00% in stage N3 and 0.46% in REM.  During the titration period of 167.4 minutes, the patient slept for 43.0 minutes in REM and nonREM, yielding a sleep efficiency of 25.7%. Sleep onset after CPAP initiation was 21.3 minutes with a REM latency of 142.5 minutes. The patient spent 17.44% of the night in stage N1 sleep, 76.74% in stage N2 sleep, 0.00% in stage N3 and 5.81% in REM.  CARDIAC DATA The 2 lead EKG demonstrated sinus rhythm. The mean heart rate was 66.14 beats per minute. Other EKG findings include: PVCs.  LEG MOVEMENT DATA The total Periodic Limb Movements of Sleep (PLMS) were 30. The PLMS index was 11.88 .  IMPRESSIONS - Severe obstructive sleep apnea occurred during the diagnostic portion of the study (AHI = 40.5/hour). An optimal PAP pressure could not be selected for this patient due to ongoing respiratory events.  - No significant central sleep apnea occurred during the diagnostic portion of the study (CAI = 0.0/hour). - Moderate oxygen desaturation was noted during the diagnostic portion of the study (Min O2 =81.00%). - No snoring was audible during the diagnostic portion of the study. - EKG findings include PVCs. - Mild periodic limb movements of sleep occurred during the study.  DIAGNOSIS - Obstructive Sleep Apnea (327.23 [G47.33 ICD-10])  RECOMMENDATIONS - As an optimal CPAP pressure could not be obtained due to ongoing respiratory events and lack of time, recommend full night CPAP titration in the lab. - Avoid alcohol, sedatives and other CNS depressants that may worsen sleep apnea and disrupt normal sleep architecture. - Sleep hygiene should be reviewed to assess factors that may improve sleep quality. -  Weight management and regular exercise should be initiated or continued.  [place], American Board of Sleep Medicine  ELECTRONICALLY SIGNED ON:  10/04/2016, 8:05 PM [place] PH:  [phone]   FX: [phone] [place]

## 2016-10-05 ENCOUNTER — Telehealth: Payer: Self-pay | Admitting: *Deleted

## 2016-10-05 ENCOUNTER — Encounter: Payer: Self-pay | Admitting: *Deleted

## 2016-10-05 DIAGNOSIS — G4733 Obstructive sleep apnea (adult) (pediatric): Secondary | ICD-10-CM

## 2016-10-05 NOTE — Telephone Encounter (Signed)
-----   Message from Quintella Reichertraci R Turner, MD sent at 10/04/2016  8:08 PM EDT ----- Please let patient know that he has severe OSA but there was not enough time for an adequate CPAP titration.  Please set up a full night in lab CPAP titration

## 2016-10-05 NOTE — Telephone Encounter (Signed)
Informed patient of titration results and verbalized understanding was indicated. Patient understands there was not enough time for an adequate CPAP titration. Patient states he can't sleep away from home. Patient understands [facility]. Patient understands his CPAP titration is scheduled for December 06 2016. Patient understands he will receive a sleep packet in a week or so. Patient understands to call if he does not receive the sleep packet in a timely manner. Patient agrees with treatment and thanked me for call

## 2016-10-19 ENCOUNTER — Encounter: Payer: Self-pay | Admitting: *Deleted

## 2016-11-02 NOTE — Telephone Encounter (Signed)
Reached out to the patient today and he informed me he has declined to have his sleep study at this time. Dr Anne FuSkains has been notified through a staff message.

## 2016-11-03 NOTE — Progress Notes (Signed)
Cardiology Office Note   Date:  11/04/2016   ID:  Nathan White, DOB [birth year], MRN [id number]  PCP:  Nathan Livings, MD  Cardiologist:  Dr. Anne White     Chief Complaint  Patient presents with  . Coronary Artery Disease      History of Present Illness: Nathan White is a 66 y.o. male who presents for CAD.    Pt has previously been seen for chest pain at the request of Dr. Sherene White. Previously had been referred to the pulmonary clinic in September 2016 because of shortness of breath/chest pain. Has a warm burning sensation in his chest, no wheezing, no cough. Per Dr. Thurston White note had had a negative cardiac workup. He has chest tightness that was reproducible with exertion more than 50 feet of walking almost every time but not always. A few times he has had a smother like sensation over the past couple months and sleeps in a recliner after taking  Xanax\\  Had CAD, stents in [facility]. Had CABG. In 83 at age 13 had MI. 12 years later had CABG. Dr. Arvilla White. Left  Radial harvested.  In Jan 2016 - 2 blockages, one was opened and one could not be opened.  Cath 04/2015 Cardiac catheterization reviewed, see results. LIMA to LAD is patent, SVG to circumflex is patent. Other SVG grafts are occluded, previously stented RCA graft is occluded. OM 3 graft is occluded. EF 30-35%Previously, now normal.   In May 2018 was in [facility] with acute diastolic HF,  After increased salt intake.  Ef was normal .    Today he still has some DOE he stops and rests and it resolves.  He feels he is doing well.  No chest pain. His wife is with him today - pt has her manage his meds and she checks his glucose. But everything has been stable.  He has a sleep study and knows he needs CPAP but does not wish to go back to have a CPAP test.  I will ask Dr. Mayford White if he must do this.  He does have oxygen at night with sleep.  He is active with walking.  After walking in his sp02 was 89% but quickly came up.  He no longer sleeps  in recliner but sleeps in bed.    Past Medical History:  Diagnosis Date  . Diabetes (HCC)   . Heart attack (HCC)   . High blood pressure   . High cholesterol     Past Surgical History:  Procedure Laterality Date  . BACK SURGERY    . Cardiac bypass    . CARDIAC CATHETERIZATION N/A 05/12/2015   Procedure: Left Heart Cath and Cors/Grafts Angiography;  Surgeon: Nathan Bihari, MD;  Location: [facility] INVASIVE CV LAB;  Service: Cardiovascular;  Laterality: N/A;  . GALLBLADDER SURGERY    . STENT PLACEMENT VASCULAR ([facility] HX)       Current Outpatient Prescriptions  Medication Sig Dispense Refill  . albuterol (PROVENTIL HFA;VENTOLIN HFA) 108 (90 Base) MCG/ACT inhaler Inhale 2 puffs into the lungs every 6 (six) hours as needed. Sob or wheezing    . ALPRAZolam (XANAX) 1 MG tablet Take 1 mg by mouth at bedtime as needed for anxiety.    Marland Kitchen aspirin 81 MG tablet Take 81 mg by mouth daily.    . Aspirin Effervescent (ALKA-SELTZER PO) Take 1 tablet by mouth daily as needed (as directed).    . carvedilol (COREG) 6.25 MG tablet Take 1 tablet (6.25 mg total) by  mouth 2 (two) times daily. 180 tablet 3  . clopidogrel (PLAVIX) 75 MG tablet Take 75 mg by mouth daily.    . fluticasone (FLONASE) 50 MCG/ACT nasal spray Place 1 spray into both nostrils daily as needed for allergies.     . furosemide (LASIX) 40 MG tablet Take 40 mg by mouth 2 (two) times daily.    Marland Kitchen. glipiZIDE (GLUCOTROL) 10 MG tablet Take 10 mg by mouth daily before breakfast.     . levothyroxine (SYNTHROID, LEVOTHROID) 100 MCG tablet Take 1 tablet by mouth daily.    . metFORMIN (GLUCOPHAGE) 500 MG tablet Take 500 mg by mouth 2 (two) times daily with a meal.    . nitroGLYCERIN (NITROSTAT) 0.4 MG SL tablet Place 1 tablet (0.4 mg total) under the tongue every 5 (five) minutes as needed for chest pain. 25 tablet 3  . pravastatin (PRAVACHOL) 80 MG tablet Take 40 mg by mouth daily.     . valsartan (DIOVAN) 80 MG tablet Take 40 mg by mouth daily.     No  current facility-administered medications for this visit.     Allergies:   Patient has no known allergies.    Social History:  The patient  reports that he quit smoking about 23 years ago. He has never used smokeless tobacco. He reports that he does not drink alcohol.   Family History:  The patient's family history includes Cancer in his brother; Heart attack in his brother, brother, and mother.    ROS:  General:no colds or fevers, no weight changes Skin:no rashes or ulcers HEENT:no blurred vision, no congestion CV:see HPI  No palpitations.  PUL:see HPI GI:no diarrhea constipation or melena, no indigestion GU:no hematuria, no dysuria MS:no joint pain, no claudication Neuro:no syncope, no lightheadedness Endo:+ diabetes stable per pt, + thyroid disease  Wt Readings from Last 3 Encounters:  11/04/16 278 lb 3.2 oz (126.2 kg)  10/03/16 280 lb (127 kg)  07/28/16 281 lb 3.2 oz (127.6 kg)     PHYSICAL EXAM: VS:  BP 124/84   Pulse 68   Ht 5\' 8"  (1.727 m)   Wt 278 lb 3.2 oz (126.2 kg)   SpO2 (!) 89%   BMI 42.30 kg/m  , BMI Body mass index is 42.3 kg/m. General:Pleasant affect, NAD Skin:Warm and dry, brisk capillary refill HEENT:normocephalic, sclera clear, mucus membranes moist Neck:supple, no JVD, no bruits  Heart:S1S2 RRR without murmur, gallup, rub or click Lungs:clear without rales, rhonchi, or wheezes RUE:AVWUAbd:soft, non tender, + BS, do not palpate liver spleen or masses Ext:no lower ext edema, 2+ pedal pulses, 2+ radial pulses Neuro:alert and oriented, X 3 MAE, follows commands, + facial symmetry    EKG:  EKG is ordered today. The ekg ordered today demonstrates SR Q wave in II,III, AVF    Recent Labs: No results found for requested labs within last 8760 hours.    Lipid Panel No results found for: CHOL, TRIG, HDL, CHOLHDL, VLDL, LDLCALC, LDLDIRECT  no recent lipids    Other studies Reviewed: Additional studies/ records that were reviewed today  include: .Procedures   Left Heart Cath and Cors/Grafts Angiography  Conclusion    Prox RCA lesion, 100% stenosed.  Mid LAD lesion, 100% stenosed.  Prox Cx to Mid Cx lesion, 100% stenosed.  LIMA was injected is normal in caliber, and is anatomically normal.  Patent LIMA graft which supplies the mid LAD  Radial artery was injected is normal in caliber, and is anatomically normal.  Patent radial artery graft  supplying the very high first marginal (almost ramus intermediate, like vessel)  SVG .  There is severe disease in the graft.  Occluded vein graft prior to the proximally stented segment in the graft that supplied the RCA  Origin to Prox Graft lesion, 100% stenosed. The lesion was previously treated with a stent (unknown type).  SVG was injected is normal in caliber.  There is severe disease in the graft.  Occluded vein graft with that supplied the third marginal branch  Origin lesion, 100% stenosed.  There is moderate to severe left ventricular systolic dysfunction.   Moderately severe global LV dysfunction with diffuse hypocontractility and an ejection fraction of 30-35%.  Severe native CAD with total occlusion of the proximal LAD after the first septal and diagonal vessel; total occlusion of the proximal circumflex; and total occlusion of the proximal RCA.  Faint collaterals arising from the septal perforating artery supplying a portion of the distal RCA.  Patent LIMA graft supplying the mid LAD.  Patent radial artery graft supplying a high marginal branch of the left circumflex coronary artery.  Occluded vein graft that supplied a distal marginal vessel.  Occluded vein graft proximal to a previously stented portion in the very proximal graft which had supplied the distal RCA.  RECOMMENDATION: Increased medical therapy.  Consider the addition of her ranolazine in addition to nitrates, beta blocker, and possible amlodipine.  Patient will follow-up with Dr.  Donato Schultz.    ECHO 07/19/16  Summary Mild concentric left ventricular hypertrophy Normal left ventricular size and systolic function with no appreciable segmental abnormality. There is moderate aortic sclerosis note, gradient across the valve is technically difficult to obtain, probably mild to moderate Aortic stenosis Signature  EF 55-60%  ASSESSMENT AND PLAN:  1.  CAD severe, with hx CABG, no chest pain only exertional dyspnea, resolves with rest.   LIMA to LAD is patent, SVG to circumflex is patent. Other SVG grafts are occluded, previously stented RCA graft is occluded. OM 3 graft is occluded. EF 30-35%Previously, now normal.  2.  chronic systolic HF - euvolemic today  Continue meds  3. HLD recheck lipids and CMP next week continue statin  4.  Sleep apnea already uses home oxygen at night but does not feel he can go through another sleep study to titrate CPAP- he tells me he slept several hours with mask that did fit.  Will check with Dr Nathan White.    5.  Morbid obesity asked him to lose 10 lbs prior to next visit.  6. DM-2 per PCP      Current medicines are reviewed with the patient today.  The patient Has no concerns regarding medicines.  The following changes have been made:  See above Labs/ tests ordered today include:see above  Disposition:   White:  see above  Signed, Nada Boozer, NP  11/04/2016 5:33 PM    [facility] [address] [place], [place], [place]  [address]/ [address] [employer] [address] [place], [place] Phone: [phone]; Fax: [phone]  [phone]

## 2016-11-04 ENCOUNTER — Ambulatory Visit (INDEPENDENT_AMBULATORY_CARE_PROVIDER_SITE_OTHER): Payer: Medicare Other | Admitting: Cardiology

## 2016-11-04 ENCOUNTER — Encounter: Payer: Self-pay | Admitting: Cardiology

## 2016-11-04 VITALS — BP 124/84 | HR 68 | Ht 68.0 in | Wt 278.2 lb

## 2016-11-04 DIAGNOSIS — I5022 Chronic systolic (congestive) heart failure: Secondary | ICD-10-CM | POA: Diagnosis not present

## 2016-11-04 DIAGNOSIS — G4733 Obstructive sleep apnea (adult) (pediatric): Secondary | ICD-10-CM

## 2016-11-04 DIAGNOSIS — I25810 Atherosclerosis of coronary artery bypass graft(s) without angina pectoris: Secondary | ICD-10-CM

## 2016-11-04 DIAGNOSIS — E7800 Pure hypercholesterolemia, unspecified: Secondary | ICD-10-CM

## 2016-11-04 DIAGNOSIS — I2510 Atherosclerotic heart disease of native coronary artery without angina pectoris: Secondary | ICD-10-CM

## 2016-11-04 MED ORDER — NITROGLYCERIN 0.4 MG SL SUBL: 0.4000 mg | SUBLINGUAL_TABLET | SUBLINGUAL | 3 refills | Status: AC | PRN

## 2016-11-04 NOTE — Patient Instructions (Signed)
Medication Instructions:  Your physician recommends that you continue on your current medications as directed. Please refer to the Current Medication list given to you today.   Labwork: Your physician recommends that you return for lab work on 8/16 for a fasting lipid and hepatic function panel. Please come that morning so that you do not wait to eat all day.   Testing/Procedures: None Ordered   Follow-Up: Your physician wants you to follow-up in: 6 months with Dr. Anne FuSkains.  You will receive a reminder letter in the mail two months in advance. If you don't receive a letter, please call our office to schedule the follow-up appointment.   Any Other Special Instructions Will Be Listed Below (If Applicable).     If you need a refill on your cardiac medications before your next appointment, please call your pharmacy.

## 2016-11-11 ENCOUNTER — Other Ambulatory Visit: Payer: Medicare Other | Admitting: *Deleted

## 2016-11-11 DIAGNOSIS — I5022 Chronic systolic (congestive) heart failure: Secondary | ICD-10-CM

## 2016-11-11 DIAGNOSIS — I10 Essential (primary) hypertension: Secondary | ICD-10-CM

## 2016-11-11 LAB — HEPATIC FUNCTION PANEL
ALK PHOS: 68 IU/L (ref 39–117)
ALT: 14 IU/L (ref 0–44)
AST: 17 IU/L (ref 0–40)
Albumin: 4.4 g/dL (ref 3.6–4.8)
BILIRUBIN TOTAL: 0.4 mg/dL (ref 0.0–1.2)
Bilirubin, Direct: 0.14 mg/dL (ref 0.00–0.40)
Total Protein: 7 g/dL (ref 6.0–8.5)

## 2016-11-11 LAB — LIPID PANEL
Chol/HDL Ratio: 3.3 ratio (ref 0.0–5.0)
Cholesterol, Total: 153 mg/dL (ref 100–199)
HDL: 47 mg/dL (ref >39)
LDL Calculated: 80 mg/dL (ref 0–99)
Triglycerides: 129 mg/dL (ref 0–149)
VLDL Cholesterol Cal: 26 mg/dL (ref 5–40)

## 2016-11-16 ENCOUNTER — Telehealth: Payer: Self-pay | Admitting: *Deleted

## 2016-11-16 NOTE — Telephone Encounter (Signed)
-----   Message from [place], [place] sent at 11/12/2016 12:24 PM EDT ----- Liver function is normal, however his bad cholesterol (LDL) is not at goal. His level is 80 and should be <70, given his CAD. He is on pravastatin, which is not the best statin at lowering LDL. He is also on max dose. We cannot further increase. Check with patient regarding compliance. If he has not been taking this nightly, then encourage him to do so. If he has, then we need to change his medication. Would recommend Lipitor 40 mg. Recheck FLP in 6-8 weeks, after either improved compliance or med change. If he has been intolerant to other statins, then we may need to refer him to lipid clinic for other therapies.

## 2016-11-18 ENCOUNTER — Encounter: Payer: Self-pay | Admitting: *Deleted

## 2016-11-18 NOTE — Telephone Encounter (Signed)
Pt returning phone call ° °

## 2016-11-26 NOTE — Telephone Encounter (Signed)
Results mailed to patient via letter by Avie ArenasPam Fleming on 11/18/16.

## 2016-12-06 ENCOUNTER — Encounter (HOSPITAL_BASED_OUTPATIENT_CLINIC_OR_DEPARTMENT_OTHER): Payer: Medicare Other

## 2017-05-10 NOTE — Progress Notes (Signed)
Cardiology Office Note    Date:  05/11/2017   ID:  Nathan White, DOB Aug 02, 1950, MRN [id number]  PCP:  Quitman Livings, MD  Cardiologist:   Donato Schultz, MD     History of Present Illness:  Nathan White is a 67 y.o. male here for follow up of chest pain and SOB. Previously had been referred to the pulmonary clinic in September 2016 because of shortness of breath/chest pain, Dr Sherene Sires. Has a warm burning sensation in his chest, no wheezing, no cough. Per Dr. Thurston Hole note had had a negative cardiac workup. He has chest tightness that was reproducible with exertion more than 50 feet of walking almost every time but not always. A few times he has had a smother like sensation over the past couple months and sleeps in a recliner after taking  Xanax.  If lifts something burning. Had CAD, stents in [place]. Had CABG. In 33 at age 1 had MI. 12 years later had CABG. Dr. Arvilla Market. Left  Radial harvested.  In Jan 2016 - 2 areas of stenosis, one was opened and one could not be opened according to patient.   Grandson died from drunk driver. On motorcycle.    Dr. Richardine Service.   06/30/15-overall he is doing better after we discontinued his HCTZ, Imdur, decreased his Lasix. Blood pressure prior visit was quite low and he felt "washed out". No chest pain, no syncope, no bleeding. Reviewed cardiac catheterization once again.  11/19/15-other than shortness of breath and decreased energy, no other complaints. We discussed weight loss again. No chest pain. Just shortness of breath.  07/28/16-overall seems to be doing quite well today. He was admitted to [facility] with acute diastolic heart failure. [place] hurting him. There were tight with fluid. He had eaten many hotdogs previously. His ejection fraction was normal. This is an improvement. No PE on CT. No DVT on Doppler. [place] - took off 8 pounds fluid. IV lasix.  Instruction is to take an  extra Lasix if weight increased. He was discharged with oxygen.  I reviewed Dr. Sherene Sires note from 03/21/15 and his FEV1 was 63%.   Past Medical History:  Diagnosis Date  . Diabetes (HCC)   . Heart attack (HCC)   . High blood pressure   . High cholesterol     Past Surgical History:  Procedure Laterality Date  . BACK SURGERY    . Cardiac bypass    . CARDIAC CATHETERIZATION N/A 05/12/2015   Procedure: Left Heart Cath and Cors/Grafts Angiography;  Surgeon: Lennette Bihari, MD;  Location: [facility] INVASIVE CV LAB;  Service: Cardiovascular;  Laterality: N/A;  . GALLBLADDER SURGERY    . STENT PLACEMENT VASCULAR ([facility] HX)      Outpatient Medications Prior to Visit  Medication Sig Dispense Refill  . albuterol (PROVENTIL HFA;VENTOLIN HFA) 108 (90 Base) MCG/ACT inhaler Inhale 2 puffs into the lungs every 6 (six) hours as needed. Sob or wheezing    . ALPRAZolam (XANAX) 1 MG tablet Take 1 mg by mouth at bedtime as needed for anxiety.    Marland Kitchen aspirin 81 MG tablet Take 81 mg by mouth daily.    . Aspirin Effervescent (ALKA-SELTZER PO) Take 1 tablet by mouth daily as needed (as directed).    . carvedilol (COREG) 6.25 MG tablet Take 1 tablet (6.25 mg total) by mouth 2 (two) times daily. 180 tablet 3  . clopidogrel (PLAVIX) 75 MG tablet  Take 75 mg by mouth daily.    . fluticasone (FLONASE) 50 MCG/ACT nasal spray Place 1 spray into both nostrils daily as needed for allergies.     . furosemide (LASIX) 40 MG tablet Take 40 mg by mouth 2 (two) times daily.    Marland Kitchen glipiZIDE (GLUCOTROL) 10 MG tablet Take 10 mg by mouth daily before breakfast.     . levothyroxine (SYNTHROID, LEVOTHROID) 125 MCG tablet Take 125 mcg by mouth daily before breakfast.    . metFORMIN (GLUCOPHAGE) 500 MG tablet Take 500 mg by mouth 2 (two) times daily with a meal.    . nitroGLYCERIN (NITROSTAT) 0.4 MG SL tablet Place 1 tablet (0.4 mg total) under the tongue every 5 (five) minutes as needed for chest pain. 25 tablet 3  . pravastatin (PRAVACHOL)  80 MG tablet Take 80 mg by mouth daily.     . valsartan (DIOVAN) 80 MG tablet Take 40 mg by mouth daily.    Marland Kitchen levothyroxine (SYNTHROID, LEVOTHROID) 100 MCG tablet Take 1 tablet by mouth daily.     No facility-administered medications prior to visit.      Allergies:   Patient has no known allergies.   Social History   Socioeconomic History  . Marital status: Married    Spouse name: None  . Number of children: None  . Years of education: None  . Highest education level: None  Social Needs  . Financial resource strain: None  . Food insecurity - worry: None  . Food insecurity - inability: None  . Transportation needs - medical: None  . Transportation needs - non-medical: None  Occupational History  . Occupation: retired  Tobacco Use  . Smoking status: Former Smoker    Last attempt to quit: 07/31/1993    Years since quitting: 23.7  . Smokeless tobacco: Never Used  Substance and Sexual Activity  . Alcohol use: No    Alcohol/week: 0.0 oz  . Drug use: None  . Sexual activity: None  Other Topics Concern  . None  Social History Narrative  . None     Family History:  The patient's family history includes Cancer in his brother; Heart attack in his brother, brother, and mother.   ROS:   Please see the history of present illness.    Review of Systems  All other systems reviewed and are negative.     PHYSICAL EXAM:   VS:  BP 100/64   Pulse 78   Ht 5\' 8"  (1.727 m)   Wt 278 lb 6.4 oz (126.3 kg)   SpO2 (!) 88%   BMI 42.33 kg/m    GEN: Well nourished, well developed, in no acute distress, obese HEENT: normal  Neck: no JVD, carotid bruits, or masses Cardiac: RRR; no murmurs, rubs, or gallops,no edema  Respiratory:  clear to auscultation bilaterally, normal work of breathing GI: soft, nontender, nondistended, + BS MS: no deformity or atrophy  Skin: warm and dry, no rash Neuro:  Alert and Oriented x 3, Strength and sensation are intact Psych: euthymic mood, full  affect   Wt Readings from Last 3 Encounters:  05/11/17 278 lb 6.4 oz (126.3 kg)  11/04/16 278 lb 3.2 oz (126.2 kg)  10/03/16 280 lb (127 kg)      Studies/Labs Reviewed:   EKG:  EKG is ordered today.  The ekg ordered today demonstrates  05/09/15 shows normal sinus rhythm, 75 with nonspecific ST-T wave changes, small Q waves in the inferior leads noted.  Recent Labs: 11/11/2016: ALT  14   Lipid Panel    Component Value Date/Time   CHOL 153 11/11/2016 0845   TRIG 129 11/11/2016 0845   HDL 47 11/11/2016 0845   CHOLHDL 3.3 11/11/2016 0845   LDLCALC 80 11/11/2016 0845    Additional studies/ records that were reviewed today include:  - spirometry 05/10/14 FEV1 (1.42) 44% ratio 84  - Cards eval in [facility] 11/14/14 [facility] (care everywhere) > rec lexiscan but not done  - trial off acei 12/23/2014 > marked improvement  - 03/21/2015 Walked RA x 3 laps @ 185 ft each stopped due to End of study, nl pace, no sob or desat But mild chest burning that resolved w/in a few min at rest  - PFT's 03/21/2015 FEV1 2.07 (63 % ) ratio 86 p 10 % improvement from saba with DLCO 69 % corrects to 120 % for alv volume and ERV 30%   Cardiac Cath 05/12/15:  Prox RCA lesion, 100% stenosed.  Mid LAD lesion, 100% stenosed.  Prox Cx to Mid Cx lesion, 100% stenosed.  LIMA was injected is normal in caliber, and is anatomically normal.  Patent LIMA graft which supplies the mid LAD  Radial artery was injected is normal in caliber, and is anatomically normal.  Patent radial artery graft supplying the very high first marginal (almost ramus intermediate, like vessel)  SVG .  There is severe disease in the graft.  Occluded vein graft prior to the proximally stented segment in the graft that supplied the RCA  Origin to Prox Graft lesion, 100% stenosed. The lesion was previously treated with a stent (unknown type).  SVG was injected is normal in caliber.  There is severe disease in the graft.  Occluded  vein graft with that supplied the third marginal branch  Origin lesion, 100% stenosed.  There is moderate to severe left ventricular systolic dysfunction.  Moderately severe global LV dysfunction with diffuse hypocontractility and an ejection fraction of 30-35%.  Severe native CAD with total occlusion of the proximal LAD after the first septal and diagonal vessel; total occlusion of the proximal circumflex; and total occlusion of the proximal RCA. Faint collaterals arising from the septal perforating artery supplying a portion of the distal RCA.  Patent LIMA graft supplying the mid LAD.  Patent radial artery graft supplying a high marginal branch of the left circumflex coronary artery.  Occluded vein graft that supplied a distal marginal vessel.  Occluded vein graft proximal to a previously stented portion in the very proximal graft which had supplied the distal RCA.  RECOMMENDATION: Increased medical therapy. Consider the addition of her ranolazine in addition to nitrates, beta blocker, and possible amlodipine.  [facility] Echocardiogram April 2018-EF 55%. No PE on CT. No DVT in lower extremities.   ASSESSMENT:    1. Angina decubitus (HCC)   2. Coronary artery disease involving native coronary artery of native heart without angina pectoris   3. Morbid obesity due to excess calories (HCC)   4. OSA (obstructive sleep apnea)      PLAN:  In order of problems listed above:  Severe coronary artery disease status post bypass with exertional angina -Cardiac catheterization reviewed, see results above. LIMA to LAD is patent, SVG to circumflex is patent. Other SVG grafts are occluded, previously stented RCA graft is occluded. OM 3 graft is occluded. EF 30-35%Previously, now normal.  At prior visit his blood pressure was very low and we decided to discontinue his hydrochlorothiazide, decrease his Lasix to 40 mg a day and  stop his isosorbide.   He remains quite active.  Still goes to the job. He is not one just to sit around. Shortness of breath continues to be his main complaint.  Exertional angina/SOB -Previous classic anginal symptoms. Exertional in quality. Improved. Medical management based upon cardiac catheterization. No changes  Dilated cardiomyopathy-resolved -As above. No significant changes made.  -EF now 55%  COPD - Dr. Sherene Sires has seen in the past. ACE inhibitor was stopped, valsartan was started. No longer needs to see he states. FEV1 63% previously. Per primary team.   Morbid obesity -continue to encourage weight loss. Out of all of our interventions I think that this would be the best one for him. He understands that he is carrying around 100 extra pounds. Continue to encourage weight loss.   Hyperlipidemia -currently on pravastatin, LDL 80  OSA  - Dr. Mayford Knife.  6 month Vernona Rieger 12 months me   Medication Adjustments/Labs and Tests Ordered: Current medicines are reviewed at length with the patient today.  Concerns regarding medicines are outlined above.  Medication changes, Labs and Tests ordered today are listed in the Patient Instructions below. Patient Instructions  Medication Instructions:  Your physician recommends that you continue on your current medications as directed. Please refer to the Current Medication list given to you today.  Labwork: None  Testing/Procedures: None  Follow-Up: Your physician recommends that you schedule a follow-up appointment in: 6 months with Nada Boozer, NP.  Your physician wants you to follow-up in: 1 year with Dr. Anne Fu.  You will receive a reminder letter in the mail two months in advance. If you don't receive a letter, please call our office to schedule the follow-up appointment.    Any Other Special Instructions Will Be Listed Below (If Applicable).     If you need a refill on your cardiac medications before your next appointment, please call your pharmacy.        Signed, Donato Schultz, MD  05/11/2017 10:42 AM    [facility] [address] [place], [place], [place]  [address] Phone: [phone]; Fax: [phone]

## 2017-05-11 ENCOUNTER — Encounter: Payer: Self-pay | Admitting: Cardiology

## 2017-05-11 ENCOUNTER — Ambulatory Visit: Payer: Medicare Other | Admitting: Cardiology

## 2017-05-11 VITALS — BP 100/64 | HR 78 | Ht 68.0 in | Wt 278.4 lb

## 2017-05-11 DIAGNOSIS — I208 Other forms of angina pectoris: Secondary | ICD-10-CM | POA: Diagnosis not present

## 2017-05-11 DIAGNOSIS — G4733 Obstructive sleep apnea (adult) (pediatric): Secondary | ICD-10-CM

## 2017-05-11 DIAGNOSIS — I2510 Atherosclerotic heart disease of native coronary artery without angina pectoris: Secondary | ICD-10-CM

## 2017-05-11 NOTE — Patient Instructions (Signed)
Medication Instructions:  Your physician recommends that you continue on your current medications as directed. Please refer to the Current Medication list given to you today.  Labwork: None  Testing/Procedures: None  Follow-Up: Your physician recommends that you schedule a follow-up appointment in: 6 months with Nada BoozerLaura Ingold, NP.  Your physician wants you to follow-up in: 1 year with Dr. Anne FuSkains.  You will receive a reminder letter in the mail two months in advance. If you don't receive a letter, please call our office to schedule the follow-up appointment.    Any Other Special Instructions Will Be Listed Below (If Applicable).     If you need a refill on your cardiac medications before your next appointment, please call your pharmacy.

## 2017-11-07 ENCOUNTER — Ambulatory Visit: Payer: Medicare Other | Admitting: Cardiology

## 2017-11-07 ENCOUNTER — Encounter: Payer: Self-pay | Admitting: Cardiology

## 2017-11-07 VITALS — BP 108/66 | HR 71 | Ht 68.0 in | Wt 282.8 lb

## 2017-11-07 DIAGNOSIS — E7800 Pure hypercholesterolemia, unspecified: Secondary | ICD-10-CM | POA: Diagnosis not present

## 2017-11-07 DIAGNOSIS — I25118 Atherosclerotic heart disease of native coronary artery with other forms of angina pectoris: Secondary | ICD-10-CM | POA: Diagnosis not present

## 2017-11-07 DIAGNOSIS — I2510 Atherosclerotic heart disease of native coronary artery without angina pectoris: Secondary | ICD-10-CM

## 2017-11-07 DIAGNOSIS — I208 Other forms of angina pectoris: Secondary | ICD-10-CM

## 2017-11-07 NOTE — Patient Instructions (Signed)
Medication Instructions:  The current medical regimen is effective;  continue present plan and medications.  Follow-Up: Follow up in 6 months with Nathan BoozerLaura Ingold, NP.  You will receive a letter in the mail 2 months before you are due.  Please call Nathan White when you receive this letter to schedule your follow up appointment.  Follow up in 1 year with Nathan White.  You will receive a letter in the mail 2 months before you are due.  Please call Nathan White when you receive this letter to schedule your follow up appointment  If you need a refill on your cardiac medications before your next appointment, please call your pharmacy.   Calorie Counting for Weight Loss Calories are units of energy. Your body needs a certain amount of calories from food to keep you going throughout the day. When you eat more calories than your body needs, your body stores the extra calories as fat. When you eat fewer calories than your body needs, your body burns fat to get the energy it needs. Calorie counting means keeping track of how many calories you eat and drink each day. Calorie counting can be helpful if you need to lose weight. If you make sure to eat fewer calories than your body needs, you should lose weight. Ask your health care provider what a healthy weight is for you. For calorie counting to work, you will need to eat the right number of calories in a day in order to lose a healthy amount of weight per week. A dietitian can help you determine how many calories you need in a day and will give you suggestions on how to reach your calorie goal.  A healthy amount of weight to lose per week is usually 1-2 lb (0.5-0.9 kg). This usually means that your daily calorie intake should be reduced by 500-750 calories.  Eating 1,200 - 1,500 calories per day can help most women lose weight.  Eating 1,500 - 1,800 calories per day can help most men lose weight.  What is my plan? My goal is to have __________ calories per day. If I have  this many calories per day, I should lose around __________ pounds per week. What do I need to know about calorie counting? In order to meet your daily calorie goal, you will need to:  Find out how many calories are in each food you would like to eat. Try to do this before you eat.  Decide how much of the food you plan to eat.  Write down what you ate and how many calories it had. Doing this is called keeping a food log.  To successfully lose weight, it is important to balance calorie counting with a healthy lifestyle that includes regular activity. Aim for 150 minutes of moderate exercise (such as walking) or 75 minutes of vigorous exercise (such as running) each week. Where do I find calorie information?  The number of calories in a food can be found on a Nutrition Facts label. If a food does not have a Nutrition Facts label, try to look up the calories online or ask your dietitian for help. Remember that calories are listed per serving. If you choose to have more than one serving of a food, you will have to multiply the calories per serving by the amount of servings you plan to eat. For example, the label on a package of bread might say that a serving size is 1 slice and that there are 90 calories in a serving.  If you eat 1 slice, you will have eaten 90 calories. If you eat 2 slices, you will have eaten 180 calories. How do I keep a food log? Immediately after each meal, record the following information in your food log:  What you ate. Don't forget to include toppings, sauces, and other extras on the food.  How much you ate. This can be measured in cups, ounces, or number of items.  How many calories each food and drink had.  The total number of calories in the meal.  Keep your food log near you, such as in a small notebook in your pocket, or use a mobile app or website. Some programs will calculate calories for you and show you how many calories you have left for the day to meet your  goal. What are some calorie counting tips?  Use your calories on foods and drinks that will fill you up and not leave you hungry: ? Some examples of foods that fill you up are nuts and nut butters, vegetables, lean proteins, and high-fiber foods like whole grains. High-fiber foods are foods with more than 5 g fiber per serving. ? Drinks such as sodas, specialty coffee drinks, alcohol, and juices have a lot of calories, yet do not fill you up.  Eat nutritious foods and avoid empty calories. Empty calories are calories you get from foods or beverages that do not have many vitamins or protein, such as candy, sweets, and soda. It is better to have a nutritious high-calorie food (such as an avocado) than a food with few nutrients (such as a bag of chips).  Know how many calories are in the foods you eat most often. This will help you calculate calorie counts faster.  Pay attention to calories in drinks. Low-calorie drinks include water and unsweetened drinks.  Pay attention to nutrition labels for "low fat" or "fat free" foods. These foods sometimes have the same amount of calories or more calories than the full fat versions. They also often have added sugar, starch, or salt, to make up for flavor that was removed with the fat.  Find a way of tracking calories that works for you. Get creative. Try different apps or programs if writing down calories does not work for you. What are some portion control tips?  Know how many calories are in a serving. This will help you know how many servings of a certain food you can have.  Use a measuring cup to measure serving sizes. You could also try weighing out portions on a kitchen scale. With time, you will be able to estimate serving sizes for some foods.  Take some time to put servings of different foods on your favorite plates, bowls, and cups so you know what a serving looks like.  Try not to eat straight from a bag or box. Doing this can lead to  overeating. Put the amount you would like to eat in a cup or on a plate to make sure you are eating the right portion.  Use smaller plates, glasses, and bowls to prevent overeating.  Try not to multitask (for example, watch TV or use your computer) while eating. If it is time to eat, sit down at a table and enjoy your food. This will help you to know when you are full. It will also help you to be aware of what you are eating and how much you are eating. What are tips for following this plan? Reading food labels  Check the  calorie count compared to the serving size. The serving size may be smaller than what you are used to eating.  Check the source of the calories. Make sure the food you are eating is high in vitamins and protein and low in saturated and trans fats. Shopping  Read nutrition labels while you shop. This will help you make healthy decisions before you decide to purchase your food.  Make a grocery list and stick to it. Cooking  Try to cook your favorite foods in a healthier way. For example, try baking instead of frying.  Use low-fat dairy products. Meal planning  Use more fruits and vegetables. Half of your plate should be fruits and vegetables.  Include lean proteins like poultry and fish. How do I count calories when eating out?  Ask for smaller portion sizes.  Consider sharing an entree and sides instead of getting your own entree.  If you get your own entree, eat only half. Ask for a box at the beginning of your meal and put the rest of your entree in it so you are not tempted to eat it.  If calories are listed on the menu, choose the lower calorie options.  Choose dishes that include vegetables, fruits, whole grains, low-fat dairy products, and lean protein.  Choose items that are boiled, broiled, grilled, or steamed. Stay away from items that are buttered, battered, fried, or served with cream sauce. Items labeled "crispy" are usually fried, unless stated  otherwise.  Choose water, low-fat milk, unsweetened iced tea, or other drinks without added sugar. If you want an alcoholic beverage, choose a lower calorie option such as a glass of wine or light beer.  Ask for dressings, sauces, and syrups on the side. These are usually high in calories, so you should limit the amount you eat.  If you want a salad, choose a garden salad and ask for grilled meats. Avoid extra toppings like bacon, cheese, or fried items. Ask for the dressing on the side, or ask for olive oil and vinegar or lemon to use as dressing.  Estimate how many servings of a food you are given. For example, a serving of cooked rice is  cup or about the size of half a baseball. Knowing serving sizes will help you be aware of how much food you are eating at restaurants. The list below tells you how big or small some common portion sizes are based on everyday objects: ? 1 oz-4 stacked dice. ? 3 oz-1 deck of cards. ? 1 tsp-1 die. ? 1 Tbsp- a ping-pong ball. ? 2 Tbsp-1 ping-pong ball. ?  cup- baseball. ? 1 cup-1 baseball. Summary  Calorie counting means keeping track of how many calories you eat and drink each day. If you eat fewer calories than your body needs, you should lose weight.  A healthy amount of weight to lose per week is usually 1-2 lb (0.5-0.9 kg). This usually means reducing your daily calorie intake by 500-750 calories.  The number of calories in a food can be found on a Nutrition Facts label. If a food does not have a Nutrition Facts label, try to look up the calories online or ask your dietitian for help.  Use your calories on foods and drinks that will fill you up, and not on foods and drinks that will leave you hungry.  Use smaller plates, glasses, and bowls to prevent overeating. This information is not intended to replace advice given to you by your health care provider. Make  sure you discuss any questions you have with your health care provider. Document  Released: 03/15/2005 Document Revised: 02/13/2016 Document Reviewed: 02/13/2016 Elsevier Interactive Patient Education  [employer].

## 2017-11-07 NOTE — Progress Notes (Signed)
Cardiology Office Note    Date:  11/07/2017   ID:  Nathan White, DOB 15-Oct-1950, MRN [id number]  PCP:  Quitman Livings, MD  Cardiologist:   Donato Schultz, MD     History of Present Illness:  Nathan White is a 67 y.o. male here for follow up of chest pain and SOB. Previously had been referred to the pulmonary clinic in September 2016 because of shortness of breath/chest pain, Dr Sherene Sires. Has a warm burning sensation in his chest, no wheezing, no cough. Per Dr. Thurston Hole note had had a negative cardiac workup. He has chest tightness that was reproducible with exertion more than 50 feet of walking almost every time but not always. A few times he has had a smother like sensation over the past couple months and sleeps in a recliner after taking  Xanax.  If lifts something burning. Had CAD, stents in [place]. Had CABG. In 44 at age 38 had MI. 12 years later had CABG. Dr. Arvilla Market. Left  Radial harvested.  In Jan 2016 - 2 areas of stenosis, one was opened and one could not be opened according to patient.   Grandson died from drunk driver. On motorcycle.    Dr. Richardine Service.   06/30/15-overall he is doing better after we discontinued his HCTZ, Imdur, decreased his Lasix. Blood pressure prior visit was quite low and he felt "washed out". No chest pain, no syncope, no bleeding. Reviewed cardiac catheterization once again.  11/19/15-other than shortness of breath and decreased energy, no other complaints. We discussed weight loss again. No chest pain. Just shortness of breath.  07/28/16-overall seems to be doing quite well today. He was admitted to [facility] with acute diastolic heart failure. [place] hurting him. There were tight with fluid. He had eaten many hotdogs previously. His ejection fraction was normal. This is an improvement. No PE on CT. No DVT on Doppler. [place] - took off 8 pounds fluid. IV lasix.  Instruction is to take an  extra Lasix if weight increased. He was discharged with oxygen.  I reviewed Dr. Sherene Sires note from 03/21/15 and his FEV1 was 63%.  11/07/17 - sleep   Past Medical History:  Diagnosis Date  . Diabetes (HCC)   . Heart attack (HCC)   . High blood pressure   . High cholesterol     Past Surgical History:  Procedure Laterality Date  . BACK SURGERY    . Cardiac bypass    . CARDIAC CATHETERIZATION N/A 05/12/2015   Procedure: Left Heart Cath and Cors/Grafts Angiography;  Surgeon: Lennette Bihari, MD;  Location: [facility] INVASIVE CV LAB;  Service: Cardiovascular;  Laterality: N/A;  . GALLBLADDER SURGERY    . STENT PLACEMENT VASCULAR ([facility] HX)      Outpatient Medications Prior to Visit  Medication Sig Dispense Refill  . aspirin 81 MG tablet Take 81 mg by mouth daily.    . Aspirin Effervescent (ALKA-SELTZER PO) Take 1 tablet by mouth daily as needed (as directed).    . carvedilol (COREG) 6.25 MG tablet Take 1 tablet (6.25 mg total) by mouth 2 (two) times daily. 180 tablet 3  . clopidogrel (PLAVIX) 75 MG tablet Take 75 mg by mouth daily.    . fluticasone (FLONASE) 50 MCG/ACT nasal spray Place 1 spray into both nostrils daily as needed for allergies.     . furosemide (LASIX) 40 MG tablet Take 40 mg by mouth 2 (two)  times daily.    Marland Kitchen glipiZIDE (GLUCOTROL) 10 MG tablet Take 10 mg by mouth daily before breakfast.     . levothyroxine (SYNTHROID, LEVOTHROID) 125 MCG tablet Take 125 mcg by mouth daily before breakfast.    . metFORMIN (GLUCOPHAGE) 500 MG tablet Take 500 mg by mouth 2 (two) times daily with a meal.    . nitroGLYCERIN (NITROSTAT) 0.4 MG SL tablet Place 1 tablet (0.4 mg total) under the tongue every 5 (five) minutes as needed for chest pain. 25 tablet 3  . pravastatin (PRAVACHOL) 80 MG tablet Take 80 mg by mouth daily.     . valsartan (DIOVAN) 80 MG tablet Take 40 mg by mouth daily.    Marland Kitchen albuterol (PROVENTIL HFA;VENTOLIN HFA) 108 (90 Base) MCG/ACT inhaler Inhale 2 puffs into the lungs every 6  (six) hours as needed. Sob or wheezing    . ALPRAZolam (XANAX) 1 MG tablet Take 1 mg by mouth at bedtime as needed for anxiety.     No facility-administered medications prior to visit.      Allergies:   Patient has no known allergies.   Social History   Socioeconomic History  . Marital status: Married    Spouse name: Not on file  . Number of children: Not on file  . Years of education: Not on file  . Highest education level: Not on file  Occupational History  . Occupation: retired  Engineer, production  . Financial resource strain: Not on file  . Food insecurity:    Worry: Not on file    Inability: Not on file  . Transportation needs:    Medical: Not on file    Non-medical: Not on file  Tobacco Use  . Smoking status: Former Smoker    Last attempt to quit: 07/31/1993    Years since quitting: 24.2  . Smokeless tobacco: Never Used  Substance and Sexual Activity  . Alcohol use: No    Alcohol/week: 0.0 standard drinks  . Drug use: Not on file  . Sexual activity: Not on file  Lifestyle  . Physical activity:    Days per week: Not on file    Minutes per session: Not on file  . Stress: Not on file  Relationships  . Social connections:    Talks on phone: Not on file    Gets together: Not on file    Attends religious service: Not on file    Active member of club or organization: Not on file    Attends meetings of clubs or organizations: Not on file    Relationship status: Not on file  Other Topics Concern  . Not on file  Social History Narrative  . Not on file     Family History:  The patient's family history includes Cancer in his brother; Heart attack in his brother, brother, and mother.   ROS:   Please see the history of present illness.    Review of Systems  All other systems reviewed and are negative.     PHYSICAL EXAM:   VS:  BP 108/66   Pulse 71   Ht 5\' 8"  (1.727 m)   Wt 282 lb 12.8 oz (128.3 kg)   SpO2 (!) 89%   BMI 43.00 kg/m    GEN: Well nourished, well  developed, in no acute distress  HEENT: normal  Neck: no JVD, carotid bruits, or masses Cardiac: RRR; no murmurs, rubs, or gallops,no edema  Respiratory:  clear to auscultation bilaterally, normal work of breathing GI: soft,  nontender, nondistended, + BS MS: no deformity or atrophy  Skin: warm and dry, no rash Neuro:  Alert and Oriented x 3, Strength and sensation are intact Psych: euthymic mood, full affect   Wt Readings from Last 3 Encounters:  11/07/17 282 lb 12.8 oz (128.3 kg)  05/11/17 278 lb 6.4 oz (126.3 kg)  11/04/16 278 lb 3.2 oz (126.2 kg)      Studies/Labs Reviewed:   EKG:  EKG is ordered today.  The ekg ordered today demonstrates  05/09/15 shows normal sinus rhythm, 75 with nonspecific ST-T wave changes, small Q waves in the inferior leads noted.  Recent Labs: 11/11/2016: ALT 14   Lipid Panel    Component Value Date/Time   CHOL 153 11/11/2016 0845   TRIG 129 11/11/2016 0845   HDL 47 11/11/2016 0845   CHOLHDL 3.3 11/11/2016 0845   LDLCALC 80 11/11/2016 0845    Additional studies/ records that were reviewed today include:  - spirometry 05/10/14 FEV1 (1.42) 44% ratio 84  - Cards eval in [facility] 11/14/14 [facility] (care everywhere) > rec lexiscan but not done  - trial off acei 12/23/2014 > marked improvement  - 03/21/2015 Walked RA x 3 laps @ 185 ft each stopped due to End of study, nl pace, no sob or desat But mild chest burning that resolved w/in a few min at rest  - PFT's 03/21/2015 FEV1 2.07 (63 % ) ratio 86 p 10 % improvement from saba with DLCO 69 % corrects to 120 % for alv volume and ERV 30%   Cardiac Cath 05/12/15:  Prox RCA lesion, 100% stenosed.  Mid LAD lesion, 100% stenosed.  Prox Cx to Mid Cx lesion, 100% stenosed.  LIMA was injected is normal in caliber, and is anatomically normal.  Patent LIMA graft which supplies the mid LAD  Radial artery was injected is normal in caliber, and is anatomically normal.  Patent radial artery graft supplying  the very high first marginal (almost ramus intermediate, like vessel)  SVG .  There is severe disease in the graft.  Occluded vein graft prior to the proximally stented segment in the graft that supplied the RCA  Origin to Prox Graft lesion, 100% stenosed. The lesion was previously treated with a stent (unknown type).  SVG was injected is normal in caliber.  There is severe disease in the graft.  Occluded vein graft with that supplied the third marginal branch  Origin lesion, 100% stenosed.  There is moderate to severe left ventricular systolic dysfunction.  Moderately severe global LV dysfunction with diffuse hypocontractility and an ejection fraction of 30-35%.  Severe native CAD with total occlusion of the proximal LAD after the first septal and diagonal vessel; total occlusion of the proximal circumflex; and total occlusion of the proximal RCA. Faint collaterals arising from the septal perforating artery supplying a portion of the distal RCA.  Patent LIMA graft supplying the mid LAD.  Patent radial artery graft supplying a high marginal branch of the left circumflex coronary artery.  Occluded vein graft that supplied a distal marginal vessel.  Occluded vein graft proximal to a previously stented portion in the very proximal graft which had supplied the distal RCA.  RECOMMENDATION: Increased medical therapy. Consider the addition of her ranolazine in addition to nitrates, beta blocker, and possible amlodipine.  [facility] Echocardiogram April 2018-EF 55%. No PE on CT. No DVT in lower extremities.   ASSESSMENT:    1. Coronary artery disease involving native coronary artery of native heart without  angina pectoris   2. Morbid obesity due to excess calories (HCC)   3. Pure hypercholesterolemia   4. Angina decubitus (HCC)      PLAN:  In order of problems listed above:  Severe coronary artery disease status post bypass with exertional angina -Cardiac  catheterization reviewed, see results above. LIMA to LAD is patent, SVG to circumflex is patent. Other SVG grafts are occluded, previously stented RCA graft is occluded. OM 3 graft is occluded. EF was 30-35%.  Now normal.  At prior visit his blood pressure was very low and we decided to discontinue his hydrochlorothiazide, decrease his Lasix to 40 mg a day and stop his isosorbide.   He remains quite active. Still goes to the job. He is not one just to sit around. Shortness of breath continues to be his main complaint.  Sometimes at night he will be short of breath, has to sit up walk around then he is better.  Still continues to struggle with weight.  Exertional angina/SOB -Previous classic anginal symptoms. Exertional in quality. Improved. Medical management based upon cardiac catheterization. No changes.  Encourage Lasix use.  Ultimately, a 50 to 70 pound weight loss would be extremely helpful.  Dietary modifications discussed.  He also states that he went through a sleep study and was asked to come back for a second portion but he refused.  This may also help him with his breathing issues at night.  Unfortunately, does not wish to go back to the sleep lab.  Dilated cardiomyopathy-resolved -As above. No significant changes made.  -EF now 55% thankfully.  COPD - Dr. Sherene SiresWert has seen in the past. ACE inhibitor was stopped, valsartan was started. No longer needs to see he states. FEV1 63% previously. Per primary team.   Morbid obesity -continue to encourage weight loss. Out of all of our interventions I think that this would be the best one for him. He understands that he is carrying around 100 extra pounds. Continue to encourage weight loss.  Continue to work on this.  Discussed at length again today.  Hyperlipidemia -currently on pravastatin, LDL 80  OSA  - Dr. Mayford Knifeurner.  Unfortunately did not want to go back for his second part of sleep study.  6 month Vernona RiegerLaura 12 months me   Medication  Adjustments/Labs and Tests Ordered: Current medicines are reviewed at length with the patient today.  Concerns regarding medicines are outlined above.  Medication changes, Labs and Tests ordered today are listed in the Patient Instructions below. Patient Instructions  Medication Instructions:  The current medical regimen is effective;  continue present plan and medications.  Follow-Up: Follow up in 6 months with Nada BoozerLaura Ingold, NP.  You will receive a letter in the mail 2 months before you are due.  Please call us when you receive this letter to schedule your follow up appointment.  Follow up in 1 year with Dr. Anne FuSkains.  You will receive a letter in the mail 2 months before you are due.  Please call us when you receive this letter to schedule your follow up appointment  If you need a refill on your cardiac medications before your next appointment, please call your pharmacy.   Calorie Counting for Weight Loss Calories are units of energy. Your body needs a certain amount of calories from food to keep you going throughout the day. When you eat more calories than your body needs, your body stores the extra calories as fat. When you eat fewer calories than your body  needs, your body burns fat to get the energy it needs. Calorie counting means keeping track of how many calories you eat and drink each day. Calorie counting can be helpful if you need to lose weight. If you make sure to eat fewer calories than your body needs, you should lose weight. Ask your health care provider what a healthy weight is for you. For calorie counting to work, you will need to eat the right number of calories in a day in order to lose a healthy amount of weight per week. A dietitian can help you determine how many calories you need in a day and will give you suggestions on how to reach your calorie goal.  A healthy amount of weight to lose per week is usually 1-2 lb (0.5-0.9 kg). This usually means that your daily calorie  intake should be reduced by 500-750 calories.  Eating 1,200 - 1,500 calories per day can help most women lose weight.  Eating 1,500 - 1,800 calories per day can help most men lose weight.  What is my plan? My goal is to have __________ calories per day. If I have this many calories per day, I should lose around __________ pounds per week. What do I need to know about calorie counting? In order to meet your daily calorie goal, you will need to:  Find out how many calories are in each food you would like to eat. Try to do this before you eat.  Decide how much of the food you plan to eat.  Write down what you ate and how many calories it had. Doing this is called keeping a food log.  To successfully lose weight, it is important to balance calorie counting with a healthy lifestyle that includes regular activity. Aim for 150 minutes of moderate exercise (such as walking) or 75 minutes of vigorous exercise (such as running) each week. Where do I find calorie information?  The number of calories in a food can be found on a Nutrition Facts label. If a food does not have a Nutrition Facts label, try to look up the calories online or ask your dietitian for help. Remember that calories are listed per serving. If you choose to have more than one serving of a food, you will have to multiply the calories per serving by the amount of servings you plan to eat. For example, the label on a package of bread might say that a serving size is 1 slice and that there are 90 calories in a serving. If you eat 1 slice, you will have eaten 90 calories. If you eat 2 slices, you will have eaten 180 calories. How do I keep a food log? Immediately after each meal, record the following information in your food log:  What you ate. Don't forget to include toppings, sauces, and other extras on the food.  How much you ate. This can be measured in cups, ounces, or number of items.  How many calories each food and drink  had.  The total number of calories in the meal.  Keep your food log near you, such as in a small notebook in your pocket, or use a mobile app or website. Some programs will calculate calories for you and show you how many calories you have left for the day to meet your goal. What are some calorie counting tips?  Use your calories on foods and drinks that will fill you up and not leave you hungry: ? Some examples of foods  that fill you up are nuts and nut butters, vegetables, lean proteins, and high-fiber foods like whole grains. High-fiber foods are foods with more than 5 g fiber per serving. ? Drinks such as sodas, specialty coffee drinks, alcohol, and juices have a lot of calories, yet do not fill you up.  Eat nutritious foods and avoid empty calories. Empty calories are calories you get from foods or beverages that do not have many vitamins or protein, such as candy, sweets, and soda. It is better to have a nutritious high-calorie food (such as an avocado) than a food with few nutrients (such as a bag of chips).  Know how many calories are in the foods you eat most often. This will help you calculate calorie counts faster.  Pay attention to calories in drinks. Low-calorie drinks include water and unsweetened drinks.  Pay attention to nutrition labels for "low fat" or "fat free" foods. These foods sometimes have the same amount of calories or more calories than the full fat versions. They also often have added sugar, starch, or salt, to make up for flavor that was removed with the fat.  Find a way of tracking calories that works for you. Get creative. Try different apps or programs if writing down calories does not work for you. What are some portion control tips?  Know how many calories are in a serving. This will help you know how many servings of a certain food you can have.  Use a measuring cup to measure serving sizes. You could also try weighing out portions on a kitchen scale. With  time, you will be able to estimate serving sizes for some foods.  Take some time to put servings of different foods on your favorite plates, bowls, and cups so you know what a serving looks like.  Try not to eat straight from a bag or box. Doing this can lead to overeating. Put the amount you would like to eat in a cup or on a plate to make sure you are eating the right portion.  Use smaller plates, glasses, and bowls to prevent overeating.  Try not to multitask (for example, watch TV or use your computer) while eating. If it is time to eat, sit down at a table and enjoy your food. This will help you to know when you are full. It will also help you to be aware of what you are eating and how much you are eating. What are tips for following this plan? Reading food labels  Check the calorie count compared to the serving size. The serving size may be smaller than what you are used to eating.  Check the source of the calories. Make sure the food you are eating is high in vitamins and protein and low in saturated and trans fats. Shopping  Read nutrition labels while you shop. This will help you make healthy decisions before you decide to purchase your food.  Make a grocery list and stick to it. Cooking  Try to cook your favorite foods in a healthier way. For example, try baking instead of frying.  Use low-fat dairy products. Meal planning  Use more fruits and vegetables. Half of your plate should be fruits and vegetables.  Include lean proteins like poultry and fish. How do I count calories when eating out?  Ask for smaller portion sizes.  Consider sharing an entree and sides instead of getting your own entree.  If you get your own entree, eat only half. Ask for a  box at the beginning of your meal and put the rest of your entree in it so you are not tempted to eat it.  If calories are listed on the menu, choose the lower calorie options.  Choose dishes that include vegetables,  fruits, whole grains, low-fat dairy products, and lean protein.  Choose items that are boiled, broiled, grilled, or steamed. Stay away from items that are buttered, battered, fried, or served with cream sauce. Items labeled "crispy" are usually fried, unless stated otherwise.  Choose water, low-fat milk, unsweetened iced tea, or other drinks without added sugar. If you want an alcoholic beverage, choose a lower calorie option such as a glass of wine or light beer.  Ask for dressings, sauces, and syrups on the side. These are usually high in calories, so you should limit the amount you eat.  If you want a salad, choose a garden salad and ask for grilled meats. Avoid extra toppings like bacon, cheese, or fried items. Ask for the dressing on the side, or ask for olive oil and vinegar or lemon to use as dressing.  Estimate how many servings of a food you are given. For example, a serving of cooked rice is  cup or about the size of half a baseball. Knowing serving sizes will help you be aware of how much food you are eating at restaurants. The list below tells you how big or small some common portion sizes are based on everyday objects: ? 1 oz-4 stacked dice. ? 3 oz-1 deck of cards. ? 1 tsp-1 die. ? 1 Tbsp- a ping-pong ball. ? 2 Tbsp-1 ping-pong ball. ?  cup- baseball. ? 1 cup-1 baseball. Summary  Calorie counting means keeping track of how many calories you eat and drink each day. If you eat fewer calories than your body needs, you should lose weight.  A healthy amount of weight to lose per week is usually 1-2 lb (0.5-0.9 kg). This usually means reducing your daily calorie intake by 500-750 calories.  The number of calories in a food can be found on a Nutrition Facts label. If a food does not have a Nutrition Facts label, try to look up the calories online or ask your dietitian for help.  Use your calories on foods and drinks that will fill you up, and not on foods and drinks that will  leave you hungry.  Use smaller plates, glasses, and bowls to prevent overeating. This information is not intended to replace advice given to you by your health care provider. Make sure you discuss any questions you have with your health care provider. Document Released: 03/15/2005 Document Revised: 02/13/2016 Document Reviewed: 02/13/2016 Elsevier Interactive Patient Education  [employer]2018 [employer] Inc.       Signed, Donato SchultzMark Skains, MD  11/07/2017 8:46 AM    [facility] [address], [place], [place]  [address] Phone: [phone]; Fax: [phone]

## 2018-04-10 ENCOUNTER — Telehealth: Payer: Self-pay | Admitting: Cardiology

## 2018-04-10 NOTE — Telephone Encounter (Signed)
Thanks. Agree Gerda Yin, MD  

## 2018-04-10 NOTE — Telephone Encounter (Signed)
I spoke with pt's wife who reports pt has swelling in his legs and shortness of breath.  Saw primary care today and weight was up.  Primary care increased lasix and is going to see pt again on Thursday. Wife reports primary care increased lasix to 80 mg daily.  Wife checked bottle and reports this was dose pt was already taking.  She states they told primary care he was taking lasix 20 mg.  Wife will call primary care back today for clarification.  She reports pt eats a lot of salty foods.  He does weigh daily.  I encouraged wife to have pt decrease salt intake.  I told her to keep record of weights and take these to primary care appointment later this week. Pt is due for follow up in our office in February.  I scheduled pt to see Nada Boozer, NP on February 18. I told wife to let us know if primary care felt pt needed sooner appt.

## 2018-04-10 NOTE — Telephone Encounter (Signed)
New Message   Pt c/o swelling: STAT is pt has developed SOB within 24 hours  1) How much weight have you gained and in what time span? Unknown   2) If swelling, where is the swelling located? Swelling in his legs   3) Are you currently taking a fluid pill? yes  4) Are you currently SOB? No   5) Do you have a log of your daily weights (if so, list)? no  6) Have you gained 3 pounds in a day or 5 pounds in a week? Unknown   7) Have you traveled recently? No     Patients wife states that he went to the PCP this morning and they increased his fluid pill. She said two weeks ago he weighed 280 but today he weighed 287. Please call.

## 2018-05-16 ENCOUNTER — Ambulatory Visit: Payer: Medicare Other | Admitting: Cardiology

## 2018-11-28 NOTE — Progress Notes (Deleted)
Cardiology Office Note   Date:  11/30/2018   ID:  Nathan White, DOB 12/04/1950, MRN [id number]  PCP:  Nathan LivingsHassan, Sami, MD  Cardiologist:  Dr. Anne White     No chief complaint on file.    History of Present Illness: [name] is a 68 year old male with a history of CAD s/p CABG in early 2000's with most recent cardiac catheterization in 2017 showing 2/4 patent grafts, dilated cardiomyopathy with improved EF of 55-60% on Echo in 2018, chronic shortness of breath, hypertension, hyperlipidemia, diabetes mellitus, and morbid obesity who followed by Dr. Anne White and presents today for annual follow-up.   Patient has chronic shortness of breath and has previously been seen by Dr. Sherene White in Pulmonology. Last seen in 02/2015. FEV1 63% at that time. Patient previously on ACEi but this was felt to be contributing to shortness of breath and was discontinued and patient was started on Valsartan. Per Dr. Thurston White's note from that visit, "overall function and PFTs much better off ACEi with a restrictive pattern now that his explained by his body habitus and does not require pulmonary meds at this point but needs to work on weight loss." Patient has not been seen by Pulmonology since this time.   Patient had cardiac catheterization in 04/2015 for further evaluation of exertional angina. Cath showed patent 2/4 grafts with occluded vein graft to distal marginal vessel and occluded vein graft to a previously stented portion of proxima graft which supplied the distal RCA. LV was noted to have global hypokinesis with EF of 30-35% at that time. Optimal medical management was recommended. Patient was admitted [facility] in 06/2016 for acute CHF. Echo at that time showed improved EF of 55-60% with mild LVH but no appreciable segmental abnormality and possible mild to moderate aortic stenosis (moderate aortic sclerosis was noted but gradient across valve was difficult to obtain). He was diuresed with IV Lasix and instructed to  take PO Lasix as needed for weight gain at discharge. He was also discharged on home O2.   Patient last seen by Dr. Anne White in 10/2017 at which time patient continued to complain of shortness of breath but stable.   Patient presents today for follow-up. ***   Past Medical History:  Diagnosis Date   Diabetes (HCC)    Heart attack (HCC)    High blood pressure    High cholesterol     Past Surgical History:  Procedure Laterality Date   BACK SURGERY     Cardiac bypass     CARDIAC CATHETERIZATION N/A 05/12/2015   Procedure: Left Heart Cath and Cors/Grafts Angiography;  Surgeon: Nathan Biharihomas A Kelly, MD;  Location: [facility] INVASIVE CV LAB;  Service: Cardiovascular;  Laterality: N/A;   GALLBLADDER SURGERY     STENT PLACEMENT VASCULAR ([facility] HX)       Current Outpatient Medications  Medication Sig Dispense Refill   albuterol (PROVENTIL HFA;VENTOLIN HFA) 108 (90 Base) MCG/ACT inhaler Inhale 2 puffs into the lungs every 6 (six) hours as needed. Sob or wheezing     aspirin 81 MG tablet Take 81 mg by mouth daily.     Aspirin Effervescent (ALKA-SELTZER PO) Take 1 tablet by mouth daily as needed (as directed).     carvedilol (COREG) 6.25 MG tablet Take 1 tablet (6.25 mg total) by mouth 2 (two) times daily. 180 tablet 3   clopidogrel (PLAVIX) 75 MG tablet Take 75 mg by mouth daily.     fluticasone (FLONASE) 50 MCG/ACT nasal spray Place  1 spray into both nostrils daily as needed for allergies.      furosemide (LASIX) 40 MG tablet Take 40 mg by mouth 2 (two) times daily.     glipiZIDE (GLUCOTROL) 10 MG tablet Take 10 mg by mouth daily before breakfast.      levothyroxine (SYNTHROID, LEVOTHROID) 125 MCG tablet Take 125 mcg by mouth daily before breakfast.     metFORMIN (GLUCOPHAGE) 500 MG tablet Take 500 mg by mouth 2 (two) times daily with a meal.     nitroGLYCERIN (NITROSTAT) 0.4 MG SL tablet Place 1 tablet (0.4 mg total) under the tongue every 5 (five) minutes as needed for chest pain. 25  tablet 3   pravastatin (PRAVACHOL) 80 MG tablet Take 80 mg by mouth daily.      valsartan (DIOVAN) 80 MG tablet Take 40 mg by mouth daily.     No current facility-administered medications for this visit.     Allergies:   Patient has no known allergies.    Social History:  The patient  reports that he quit smoking about 25 years ago. He has never used smokeless tobacco. He reports that he does not drink alcohol.   Family History:  The patient's family history includes Cancer in his brother; Heart attack in his brother, brother, and mother.    ROS:   Please see the history of present illness.    *** All other systems reviewed and are negative.  Wt Readings from Last 3 Encounters:  11/07/17 282 lb 12.8 oz (128.3 kg)  05/11/17 278 lb 6.4 oz (126.3 kg)  11/04/16 278 lb 3.2 oz (126.2 kg)     PHYSICAL EXAM: VS:  There were no vitals taken for this visit. , BMI There is no height or weight on file to calculate BMI. General:Pleasant affect, NAD Skin:Warm and dry, brisk capillary refill HEENT:normocephalic, sclera clear, mucus membranes moist Neck:supple, no JVD, no bruits  Heart:S1S2 RRR without murmur, gallup, rub or click Lungs:clear without rales, rhonchi, or wheezes VHQ:IONG, non tender, + BS, do not palpate liver spleen or masses Ext:no lower ext edema, 2+ pedal pulses, 2+ radial pulses Neuro:alert and oriented, MAE, follows commands, + facial symmetry    EKG:  EKG is ordered today. The EKG ordered today demonstrates ***   Recent Labs: No results found for requested labs within last 8760 hours.    Lipid Panel    Component Value Date/Time   CHOL 153 11/11/2016 0845   TRIG 129 11/11/2016 0845   HDL 47 11/11/2016 0845   CHOLHDL 3.3 11/11/2016 0845   LDLCALC 80 11/11/2016 0845     Other studies Reviewed: Additional studies/ records that were reviewed today include:  Cardiac Catheterization 05/12/2015:   Prox RCA lesion, 100% stenosed.   Mid LAD lesion, 100%  stenosed.   Prox Cx to Mid Cx lesion, 100% stenosed.   LIMA was injected is normal in caliber, and is anatomically normal.   Patent LIMA graft which supplies the mid LAD   Radial artery was injected is normal in caliber, and is anatomically normal.   Patent radial artery graft supplying the very high first marginal (almost ramus intermediate, like vessel)   SVG .   There is severe disease in the graft.   Occluded vein graft prior to the proximally stented segment in the graft that supplied the RCA   Origin to Prox Graft lesion, 100% stenosed. The lesion was previously treated with a stent (unknown type).   SVG was injected is normal in  caliber.   There is severe disease in the graft.   Occluded vein graft with that supplied the third marginal branch   Origin lesion, 100% stenosed.   There is moderate to severe left ventricular systolic dysfunction.    Moderately severe global LV dysfunction with diffuse hypocontractility and an ejection fraction of 30-35%.    Severe native CAD with total occlusion of the proximal LAD after the first septal and diagonal vessel; total occlusion of the proximal circumflex; and total occlusion of the proximal RCA. Faint collaterals arising from the septal perforating artery supplying a portion of the distal RCA.    Patent LIMA graft supplying the mid LAD.    Patent radial artery graft supplying a high marginal branch of the left circumflex coronary artery.    Occluded vein graft that supplied a distal marginal vessel.    Occluded vein graft proximal to a previously stented portion in the very proximal graft which had supplied the distal RCA.    Recommendations:  Increased medical therapy. Consider the addition of her ranolazine in addition to nitrates, beta blocker, and possible amlodipine. Patient will follow-up with Dr. Donato Schultz.  _______________  Echocardiogram 07/24/2016 [facility]):  Summary: Mild concentric left  ventricular hypertrophy  Normal left ventricular size and systolic function with no appreciable  segmental abnormality.  There is moderate aortic sclerosis note, gradient across the valve is  technically difficult to obtain, probably mild to moderate aortic stenosis   ASSESSMENT AND PLAN:  CAD s/p CABG x4  - Patient has history of remote CABG in early 2000's. Most recent cardiac catheterization in 04/2015 showed patent 2/4 grafts with occluded vein graft to distal marginal vessel and occluded vein graft to a previously stented portion of proxima graft which supplied the distal RCA.  - Stable _____.  - Continue dual antiplatelet therapy with Aspirin 81mg  daily and Plavix 75mg  daily.  - Continue Coreg 6.25mg  twice daily and Pravastatin 80mg  daily.  - Previously on Isosorbide but was discontinued due to hypotension.   Dilated Cardiomyopathy  - Noted to have diffuse hypokinesis of LV with EF of 30-35% on cardiac catheterization in 04/2015. Most recent Echo in 06/2016 showed improved EF of 55-60% with mild LVH but no appreciable segmental abnormality and possible mild to moderate aortic stenosis (moderate aortic sclerosis was noted but gradient across valve was difficult to obtain).  - Continue _____.   Chronic Dyspnea  - Previously seen by Dr. Sherene Sires in Pulmonology. Last seen in 2016. FEV1 63%. Patient previously on ACEi but this was felt to be contributing to shortness of breath and was discontinued and patient was started on Valsartan. Per Dr. Thurston Hole note from that visit, "overall function and PFTs much better off ACEi with a restrictive pattern now that his explained by his body habitus and does not require pulmonary meds at this point but needs to work on weight loss."   -Previous classic anginal symptoms. Exertional in quality. Improved. Medical management based upon cardiac catheterization. No changes. Encourage Lasix use. Ultimately, a 50 to 70 pound weight loss would be extremely helpful.  Dietary modifications discussed. He also states that he went through a sleep study and was asked to come back for a second portion but he refused. This may also help him with his breathing issues at night. Unfortunately, does not wish to go back to the sleep lab. ***   Hypertension  _____   Hyperlipidemia  - Currently on Pravastatin 80mg  daily.  - Most recent LDL 80 in  10/2016. ***   Type 2 Diabetes Mellitus  - Management per primary team.   Morbid Obesity  -*** continue to encourage weight loss. Out of all of our interventions I think that this would be the best one for him. He understands that he is carrying around 100 extra pounds. Continue to encourage weight loss. Continue to work on this. Discussed at length again today.    OSA  - Patient previously had a sleep study and was asked to come back for a second portion but he refused.    Current medicines are reviewed with the patient today.  The patient Has no concerns regarding medicines.  The following changes have been made:  See above Labs/ tests ordered today include: See above  Disposition: Follow Up in 6 months with Dr. Anne White  Signed, Corrin Parkerallie E Jaleyah Longhi, PA-C  11/30/2018 8:18 AM    [facility] [address], [place], [place]  [address]/ [address] [place], [place] Phone: [phone]; Fax: [phone]  [phone]

## 2018-11-30 ENCOUNTER — Encounter: Payer: Self-pay | Admitting: Student

## 2018-11-30 ENCOUNTER — Ambulatory Visit: Payer: Medicare Other | Admitting: Student

## 2020-04-10 ENCOUNTER — Other Ambulatory Visit: Payer: Self-pay

## 2020-04-10 ENCOUNTER — Ambulatory Visit: Payer: Medicare Other | Admitting: Cardiology

## 2020-04-10 ENCOUNTER — Encounter: Payer: Self-pay | Admitting: Cardiology

## 2020-04-10 VITALS — BP 110/60 | HR 76 | Ht 68.0 in | Wt 290.0 lb

## 2020-04-10 DIAGNOSIS — I5031 Acute diastolic (congestive) heart failure: Secondary | ICD-10-CM

## 2020-04-10 DIAGNOSIS — R0602 Shortness of breath: Secondary | ICD-10-CM | POA: Diagnosis not present

## 2020-04-10 DIAGNOSIS — I2510 Atherosclerotic heart disease of native coronary artery without angina pectoris: Secondary | ICD-10-CM

## 2020-04-10 MED ORDER — METOLAZONE 5 MG PO TABS: ORAL_TABLET | ORAL | 3 refills | Status: AC

## 2020-04-10 NOTE — Patient Instructions (Signed)
Medication Instructions:  1) TAKE METOLAZONE 5 mg 30 minutes prior to your Lasix ONCE. We have given you more pills and will let you know to take more. *If you need a refill on your cardiac medications before your next appointment, please call your pharmacy*  Lab Work: TODAY! CMET, CBC, TSH, Free T4, lipids If you have labs (blood work) drawn today and your tests are completely normal, you will receive your results only by: Marland Kitchen MyChart Message (if you have MyChart) OR . A paper copy in the mail If you have any lab test that is abnormal or we need to change your treatment, we will call you to review the results.  Testing/Procedures: Your provider has requested that you have an echocardiogram. Echocardiography is a painless test that uses sound waves to create images of your heart. It provides your doctor with information about the size and shape of your heart and how well your heart's chambers and valves are working. This procedure takes approximately one hour. There are no restrictions for this procedure.  Follow-Up: Your provider recommends that you schedule a follow-up appointment in 2 weeks.

## 2020-04-10 NOTE — Progress Notes (Signed)
Cardiology Office Note:    Date:  04/10/2020   ID:  Nathan White, DOB 08-10-1950, MRN [id number]  PCP:  Quitman Livings, MD  [facility] Cardiologist:  Donato Schultz, MD  [facility] Electrophysiologist:  None   Referring MD: Quitman Livings, MD     History of Present Illness:    Nathan White is a 70 y.o. male follow-up of coronary artery disease.  CABG 2002, Dr. Arvilla Market with left radial harvested.  Age 29 myocardial infarction 1990  Cardiac catheterization January 2016 two areas of stenosis one was open one could not be opened according to the patient.  Previously admitted to [facility] in 2018 with diastolic heart failure.  Legs held onto fluid.  Salt indiscretion.  No PE on CT.  No DVT.  Discharged with Lasix.  COPD FEV1 63%  Wife died - leukemia. Granson died 7 years ago drunk driver/motorcycle  Been gaining fluid. Lasix increased from 40 bid to 80 bid.  States that he used to be 260 pounds. Insomnia.  Wakes up standing next to the dresser Worse SOB Sinus trouble.     Past Medical History:  Diagnosis Date  . CAD (coronary artery disease)   . Diabetes (HCC)   . Heart attack (HCC)   . High blood pressure   . High cholesterol     Past Surgical History:  Procedure Laterality Date  . BACK SURGERY    . Cardiac bypass    . CARDIAC CATHETERIZATION N/A 05/12/2015   Procedure: Left Heart Cath and Cors/Grafts Angiography;  Surgeon: Lennette Bihari, MD;  Location: [facility] INVASIVE CV LAB;  Service: Cardiovascular;  Laterality: N/A;  . GALLBLADDER SURGERY    . STENT PLACEMENT VASCULAR ([facility] HX)      Current Medications: Current Meds  Medication Sig  . albuterol (PROVENTIL HFA;VENTOLIN HFA) 108 (90 Base) MCG/ACT inhaler Inhale 2 puffs into the lungs every 6 (six) hours as needed. Sob or wheezing  . aspirin 81 MG tablet Take 81 mg by mouth daily.  . Aspirin Effervescent (ALKA-SELTZER PO) Take 1 tablet by mouth daily as needed (as directed).  . carvedilol (COREG) 6.25  MG tablet Take 1 tablet (6.25 mg total) by mouth 2 (two) times daily.  . clopidogrel (PLAVIX) 75 MG tablet Take 75 mg by mouth daily.  . fluticasone (FLONASE) 50 MCG/ACT nasal spray Place 1 spray into both nostrils daily as needed for allergies.   . furosemide (LASIX) 40 MG tablet Take 80 mg by mouth 2 (two) times daily.  Marland Kitchen glipiZIDE (GLUCOTROL) 10 MG tablet Take 10 mg by mouth daily before breakfast.   . levothyroxine (SYNTHROID, LEVOTHROID) 125 MCG tablet Take 125 mcg by mouth daily before breakfast.  . metFORMIN (GLUCOPHAGE) 500 MG tablet Take 500 mg by mouth 2 (two) times daily with a meal.  . metolazone (ZAROXOLYN) 5 MG tablet Take 1 tablet 30 minutes prior to taking your Lasix ONCE.  . nitroGLYCERIN (NITROSTAT) 0.4 MG SL tablet Place 1 tablet (0.4 mg total) under the tongue every 5 (five) minutes as needed for chest pain.  . pravastatin (PRAVACHOL) 80 MG tablet Take 80 mg by mouth daily.   . valsartan (DIOVAN) 80 MG tablet Take 40 mg by mouth daily.     Allergies:   Patient has no known allergies.   Social History   Socioeconomic History  . Marital status: Married    Spouse name: Not on file  . Number of children: Not on file  . Years of education: Not on  file  . Highest education level: Not on file  Occupational History  . Occupation: retired  Tobacco Use  . Smoking status: Former Smoker    Quit date: 07/31/1993    Years since quitting: 26.7  . Smokeless tobacco: Never Used  Vaping Use  . Vaping Use: Never used  Substance and Sexual Activity  . Alcohol use: No    Alcohol/week: 0.0 standard drinks  . Drug use: Not on file  . Sexual activity: Not on file  Other Topics Concern  . Not on file  Social History Narrative  . Not on file   Social Determinants of Health   Financial Resource Strain: Not on file  Food Insecurity: Not on file  Transportation Needs: Not on file  Physical Activity: Not on file  Stress: Not on file  Social Connections: Not on file     Family  History: The patient's family history includes Cancer in his brother; Heart attack in his brother, brother, and mother.  ROS:   Please see the history of present illness.     All other systems reviewed and are negative.  EKGs/Labs/Other Studies Reviewed:    The following studies were reviewed today:  - spirometry 05/10/14 FEV1 (1.42) 44% ratio 84  - Cards eval in [facility] 11/14/14 [facility] (care everywhere) > rec lexiscan but not done  - trial off acei 12/23/2014 > marked improvement  - 03/21/2015 Walked RA x 3 laps @ 185 ft each stopped due to End of study, nl pace, no sob or desat But mild chest burning that resolved w/in a few min at rest  - PFT's 03/21/2015 FEV1 2.07 (63 % ) ratio 86 p 10 % improvement from saba with DLCO 69 % corrects to 120 % for alv volume and ERV 30%   Cardiac Cath 05/12/15:  Prox RCA lesion, 100% stenosed.  Mid LAD lesion, 100% stenosed.  Prox Cx to Mid Cx lesion, 100% stenosed.  LIMA was injected is normal in caliber, and is anatomically normal.  Patent LIMA graft which supplies the mid LAD  Radial artery was injected is normal in caliber, and is anatomically normal.  Patent radial artery graft supplying the very high first marginal (almost ramus intermediate, like vessel)  SVG .  There is severe disease in the graft.  Occluded vein graft prior to the proximally stented segment in the graft that supplied the RCA  Origin to Prox Graft lesion, 100% stenosed. The lesion was previously treated with a stent (unknown type).  SVG was injected is normal in caliber.  There is severe disease in the graft.  Occluded vein graft with that supplied the third marginal branch  Origin lesion, 100% stenosed.  There is moderate to severe left ventricular systolic dysfunction.  Moderately severe global LV dysfunction with diffuse hypocontractility and an ejection fraction of 30-35%.  Severe native CAD with total occlusion of the proximal LAD after the  first septal and diagonal vessel; total occlusion of the proximal circumflex; and total occlusion of the proximal RCA. Faint collaterals arising from the septal perforating artery supplying a portion of the distal RCA.  Patent LIMA graft supplying the mid LAD.  Patent radial artery graft supplying a high marginal branch of the left circumflex coronary artery.  Occluded vein graft that supplied a distal marginal vessel.  Occluded vein graft proximal to a previously stented portion in the very proximal graft which had supplied the distal RCA.  RECOMMENDATION: Increased medical therapy. Consider the addition of her ranolazine in addition to  nitrates, beta blocker, and possible amlodipine.  [facility] Echocardiogram April 2018-EF 55%. No PE on CT. No DVT in lower extremities.  EKG:  EKG is  ordered today.  The ekg ordered today demonstrates sinus rhythm 76 nonspecific ST-T wave changes T wave inversion one aVL  Recent Labs: No results found for requested labs within last 8760 hours.  Recent Lipid Panel    Component Value Date/Time   CHOL 153 11/11/2016 0845   TRIG 129 11/11/2016 0845   HDL 47 11/11/2016 0845   CHOLHDL 3.3 11/11/2016 0845   LDLCALC 80 11/11/2016 0845     Risk Assessment/Calculations:       Physical Exam:    VS:  BP 110/60 (BP Location: Left Arm, Patient Position: Sitting, Cuff Size: Normal)   Pulse 76   Ht 5\' 8"  (1.727 m)   Wt 290 lb (131.5 kg)   BMI 44.09 kg/m     Wt Readings from Last 3 Encounters:  04/10/20 290 lb (131.5 kg)  11/07/17 282 lb 12.8 oz (128.3 kg)  05/11/17 278 lb 6.4 oz (126.3 kg)     GEN:  Well nourished, well developed in no acute distress HEENT: Normal NECK: No JVD; No carotid bruits LYMPHATICS: No lymphadenopathy CARDIAC: RRR, no murmurs, rubs, gallops RESPIRATORY:  Clear to auscultation without rales, wheezing or rhonchi  ABDOMEN: Soft, non-tender, non-distended MUSCULOSKELETAL: 3+ bilateral lower extremity  tense edema; No deformity  SKIN: Warm and dry, radial harvest vein site noted NEUROLOGIC:  Alert and oriented x 3 PSYCHIATRIC:  Normal affect   ASSESSMENT:    1. Shortness of breath   2. Acute diastolic CHF (congestive heart failure) (HCC)   3. Coronary artery disease involving native coronary artery of native heart without angina pectoris   4. Morbid obesity (HCC)    PLAN:    In order of problems listed above:  Exertional angina/shortness of breath/acute on chronic systolic/diastolic heart failure - Worsening shortness of breath, fluid retention, has been increased Lasix 80 mg twice a day last week.  -I will check an echocardiogram-previous pump function had returned to normal.  At one point he was 30 to 35%.  If low, we will try to switch to [facility]. - I will also give him metolazone 5 mg 5 pills with three refills.  I want him to take only one at this time.  Only take it our approval. We will see him back closely in clinic in 2 weeks.  Checking full lab panel. - Previously had classic anginal symptoms exertional in quality. - Continuing with aggressive medical management. - Discussed the possibility of hospitalization if absolutely necessary however given COVID situation we will continue with outpatient therapy.   Coronary artery disease post CABG - Cardiac catheterization 2017- LIMA to LAD patent, SVG to circumflex patent.  Other SVG grafts were occluded.  Previously stented RCA graft was occluded.  OM three graft occluded.  Ischemic cardiomyopathy - EF previously 30 to 35% on recent check now normal.  Prior hypotension - At previous visits blood pressure quite low we DC'd his hydrochlorothiazide and decrease Lasix to 40 mg a day and stopped his isosorbide.  Grieving - Wife recently died from leukemia.  This has been very challenging for him.  COPD - FEV1 sixty-three.  ACE inhibitor was stopped in the past.  Obstructive sleep apnea - Dr. 2018 has seen in the past.   Previously did not go back for second part of sleep study.  Morbid obesity - Continue to encourage weight  loss.  Decrease carbohydrates.  High complexity medical decision making      Medication Adjustments/Labs and Tests Ordered: Current medicines are reviewed at length with the patient today.  Concerns regarding medicines are outlined above.  Orders Placed This Encounter  Procedures  . Comprehensive metabolic panel  . CBC with Differential/Platelet  . TSH  . T4, free  . Lipid panel  . EKG 12-Lead  . ECHOCARDIOGRAM COMPLETE   Meds ordered this encounter  Medications  . metolazone (ZAROXOLYN) 5 MG tablet    Sig: Take 1 tablet 30 minutes prior to taking your Lasix ONCE.    Dispense:  5 tablet    Refill:  3    Patient Instructions  Medication Instructions:  1) TAKE METOLAZONE 5 mg 30 minutes prior to your Lasix ONCE. We have given you more pills and will let you know to take more. *If you need a refill on your cardiac medications before your next appointment, please call your pharmacy*  Lab Work: TODAY! CMET, CBC, TSH, Free T4, lipids If you have labs (blood work) drawn today and your tests are completely normal, you will receive your results only by: Marland Kitchen MyChart Message (if you have MyChart) OR . A paper copy in the mail If you have any lab test that is abnormal or we need to change your treatment, we will call you to review the results.  Testing/Procedures: Your provider has requested that you have an echocardiogram. Echocardiography is a painless test that uses sound waves to create images of your heart. It provides your doctor with information about the size and shape of your heart and how well your heart's chambers and valves are working. This procedure takes approximately one hour. There are no restrictions for this procedure.  Follow-Up: Your provider recommends that you schedule a follow-up appointment in 2 weeks.    Signed, Donato Schultz, MD  04/10/2020 10:41 AM     [facility]

## 2020-04-11 LAB — COMPREHENSIVE METABOLIC PANEL
ALT: 64 IU/L — ABNORMAL HIGH (ref 0–44)
AST: 36 IU/L (ref 0–40)
Albumin/Globulin Ratio: 1.6 (ref 1.2–2.2)
Albumin: 4.2 g/dL (ref 3.8–4.8)
Alkaline Phosphatase: 103 IU/L (ref 44–121)
BUN/Creatinine Ratio: 22 (ref 10–24)
BUN: 31 mg/dL — ABNORMAL HIGH (ref 8–27)
Bilirubin Total: 0.4 mg/dL (ref 0.0–1.2)
CO2: 32 mmol/L — ABNORMAL HIGH (ref 20–29)
Calcium: 9.7 mg/dL (ref 8.6–10.2)
Chloride: 90 mmol/L — ABNORMAL LOW (ref 96–106)
Creatinine, Ser: 1.42 mg/dL — ABNORMAL HIGH (ref 0.76–1.27)
GFR calc Af Amer: 57 mL/min/{1.73_m2} — ABNORMAL LOW (ref >59)
GFR calc non Af Amer: 50 mL/min/{1.73_m2} — ABNORMAL LOW (ref >59)
Globulin, Total: 2.6 g/dL (ref 1.5–4.5)
Glucose: 222 mg/dL — ABNORMAL HIGH (ref 65–99)
Potassium: 5 mmol/L (ref 3.5–5.2)
Sodium: 140 mmol/L (ref 134–144)
Total Protein: 6.8 g/dL (ref 6.0–8.5)

## 2020-04-11 LAB — CBC WITH DIFFERENTIAL/PLATELET
Basophils Absolute: 0 10*3/uL (ref 0.0–0.2)
Basos: 0 %
EOS (ABSOLUTE): 0 10*3/uL (ref 0.0–0.4)
Eos: 0 %
Hematocrit: 48.5 % (ref 37.5–51.0)
Hemoglobin: 16.3 g/dL (ref 13.0–17.7)
Immature Grans (Abs): 0 10*3/uL (ref 0.0–0.1)
Immature Granulocytes: 0 %
Lymphocytes Absolute: 2.8 10*3/uL (ref 0.7–3.1)
Lymphs: 23 %
MCH: 31.4 pg (ref 26.6–33.0)
MCHC: 33.6 g/dL (ref 31.5–35.7)
MCV: 93 fL (ref 79–97)
Monocytes Absolute: 1.1 10*3/uL — ABNORMAL HIGH (ref 0.1–0.9)
Monocytes: 9 %
Neutrophils Absolute: 8.5 10*3/uL — ABNORMAL HIGH (ref 1.4–7.0)
Neutrophils: 68 %
Platelets: 160 10*3/uL (ref 150–450)
RBC: 5.19 x10E6/uL (ref 4.14–5.80)
RDW: 13.1 % (ref 11.6–15.4)
WBC: 12.5 10*3/uL — ABNORMAL HIGH (ref 3.4–10.8)

## 2020-04-11 LAB — LIPID PANEL
Chol/HDL Ratio: 3 ratio (ref 0.0–5.0)
Cholesterol, Total: 205 mg/dL — ABNORMAL HIGH (ref 100–199)
HDL: 69 mg/dL (ref >39)
LDL Chol Calc (NIH): 114 mg/dL — ABNORMAL HIGH (ref 0–99)
Triglycerides: 126 mg/dL (ref 0–149)
VLDL Cholesterol Cal: 22 mg/dL (ref 5–40)

## 2020-04-11 LAB — TSH: TSH: 3.83 u[IU]/mL (ref 0.450–4.500)

## 2020-04-11 LAB — T4, FREE: Free T4: 1.38 ng/dL (ref 0.82–1.77)

## 2020-04-14 ENCOUNTER — Telehealth: Payer: Self-pay

## 2020-04-14 DIAGNOSIS — I5031 Acute diastolic (congestive) heart failure: Secondary | ICD-10-CM

## 2020-04-14 MED ORDER — ROSUVASTATIN CALCIUM 20 MG PO TABS: 20.0000 mg | ORAL_TABLET | Freq: Every day | ORAL | 3 refills | Status: AC

## 2020-04-14 NOTE — Telephone Encounter (Signed)
The patient has been notified of the result and verbalized understanding.  All questions (if any) were answered. Cordale Manera, RN 04/14/2020 2:49 PM  Patient is agreeable to switch from pravastatin to Crestor 20 mg daily. He will continue with Lasix 80 BID. Patient did take the metolazone.  He will repeat FLP/ALT in 3 months.

## 2020-04-14 NOTE — Telephone Encounter (Signed)
-----   Message from Jake Bathe, MD sent at 04/11/2020  6:57 AM EST ----- Creatinine 1.42 mildly sluggish kidney function ALT 64 mildly elevated liver enzyme No anemia Chronically mildly elevated white blood cell count Normal thyroid function LDL 114 (goal <70 with CAD) Stop pravastatin 80 and start Crestor 20mg  PO QD.  Repeat Lipid panel and ALT in 3 months  Continue with lasix 80 BID and he should have taken one metolazone to help boost diuresis  , MD

## 2020-04-18 ENCOUNTER — Other Ambulatory Visit (HOSPITAL_COMMUNITY): Payer: Medicare Other

## 2020-04-23 ENCOUNTER — Ambulatory Visit: Payer: Medicare Other | Admitting: Cardiology

## 2020-05-08 ENCOUNTER — Other Ambulatory Visit (HOSPITAL_COMMUNITY): Payer: Medicare Other

## 2020-05-21 ENCOUNTER — Ambulatory Visit: Payer: Medicare Other | Admitting: Cardiology

## 2020-05-23 ENCOUNTER — Telehealth (HOSPITAL_COMMUNITY): Payer: Self-pay | Admitting: Cardiology

## 2020-05-23 NOTE — Telephone Encounter (Signed)
Patient called and cancelled echocardiogram for 05/27/20 and states he had while in the [facility] and is going to switch Cardiac care to them. Order will be removed from the [facility].

## 2020-05-26 NOTE — Telephone Encounter (Signed)
Thanks for the update Please make sure he is not scheduled for another clinic appt.  (recent no show) Donato Schultz, MD

## 2020-05-27 ENCOUNTER — Other Ambulatory Visit (HOSPITAL_COMMUNITY): Payer: Medicare Other

## 2020-07-14 ENCOUNTER — Other Ambulatory Visit: Payer: Medicare Other

## 2024-01-26 ENCOUNTER — Other Ambulatory Visit: Payer: Self-pay | Admitting: Student

## 2024-01-27 ENCOUNTER — Other Ambulatory Visit: Payer: Self-pay | Admitting: Student
